# Patient Record
Sex: Female | Born: 1955 | Race: White | Hispanic: No | Marital: Married | Smoking: Never smoker
Health system: Southern US, Community
[De-identification: ages and names within clinical notes are randomized; demographics above are authoritative.]

## PROBLEM LIST (undated history)

## (undated) DIAGNOSIS — M1990 Unspecified osteoarthritis, unspecified site: Secondary | ICD-10-CM

## (undated) DIAGNOSIS — K219 Gastro-esophageal reflux disease without esophagitis: Secondary | ICD-10-CM

## (undated) DIAGNOSIS — R112 Nausea with vomiting, unspecified: Secondary | ICD-10-CM

## (undated) DIAGNOSIS — J45909 Unspecified asthma, uncomplicated: Secondary | ICD-10-CM

## (undated) DIAGNOSIS — Z8489 Family history of other specified conditions: Secondary | ICD-10-CM

## (undated) DIAGNOSIS — M797 Fibromyalgia: Secondary | ICD-10-CM

## (undated) DIAGNOSIS — Z9889 Other specified postprocedural states: Secondary | ICD-10-CM

## (undated) DIAGNOSIS — F419 Anxiety disorder, unspecified: Secondary | ICD-10-CM

## (undated) DIAGNOSIS — R519 Headache, unspecified: Secondary | ICD-10-CM

## (undated) HISTORY — PX: ANKLE SURGERY: SHX546

## (undated) HISTORY — PX: DILATION AND CURETTAGE OF UTERUS: SHX78

## (undated) HISTORY — PX: EYE SURGERY: SHX253

## (undated) HISTORY — PX: CERVICAL SPINE SURGERY: SHX589

## (undated) HISTORY — PX: MENISCUS REPAIR: SHX5179

## (undated) HISTORY — PX: CYST REMOVAL HAND: SHX6279

## (undated) HISTORY — PX: APPENDECTOMY: SHX54

## (undated) HISTORY — PX: OTHER SURGICAL HISTORY: SHX169

## (undated) HISTORY — PX: OVARY SURGERY: SHX727

---

## 1999-09-07 ENCOUNTER — Other Ambulatory Visit: Admission: RE | Admit: 1999-09-07 | Discharge: 1999-09-07 | Payer: Self-pay | Admitting: Obstetrics and Gynecology

## 1999-09-15 ENCOUNTER — Encounter: Admission: RE | Admit: 1999-09-15 | Discharge: 1999-09-15 | Payer: Self-pay | Admitting: Neurosurgery

## 1999-09-15 ENCOUNTER — Encounter: Payer: Self-pay | Admitting: Neurosurgery

## 1999-09-23 ENCOUNTER — Encounter: Payer: Self-pay | Admitting: Neurosurgery

## 1999-09-24 ENCOUNTER — Encounter: Payer: Self-pay | Admitting: Neurosurgery

## 1999-09-24 ENCOUNTER — Ambulatory Visit (HOSPITAL_COMMUNITY): Admission: RE | Admit: 1999-09-24 | Discharge: 1999-09-25 | Payer: Self-pay | Admitting: Neurosurgery

## 1999-10-14 ENCOUNTER — Encounter: Payer: Self-pay | Admitting: Neurosurgery

## 1999-10-14 ENCOUNTER — Encounter: Admission: RE | Admit: 1999-10-14 | Discharge: 1999-10-14 | Payer: Self-pay | Admitting: Neurosurgery

## 1999-10-19 ENCOUNTER — Encounter: Admission: RE | Admit: 1999-10-19 | Discharge: 1999-10-19 | Payer: Self-pay | Admitting: Neurosurgery

## 1999-10-19 ENCOUNTER — Encounter: Payer: Self-pay | Admitting: Neurosurgery

## 1999-11-18 ENCOUNTER — Encounter: Payer: Self-pay | Admitting: Neurosurgery

## 1999-11-18 ENCOUNTER — Encounter: Admission: RE | Admit: 1999-11-18 | Discharge: 1999-11-18 | Payer: Self-pay | Admitting: Neurosurgery

## 2000-07-17 ENCOUNTER — Encounter: Payer: Self-pay | Admitting: Neurosurgery

## 2000-07-17 ENCOUNTER — Encounter: Admission: RE | Admit: 2000-07-17 | Discharge: 2000-07-17 | Payer: Self-pay | Admitting: Neurosurgery

## 2000-11-13 ENCOUNTER — Other Ambulatory Visit: Admission: RE | Admit: 2000-11-13 | Discharge: 2000-11-13 | Payer: Self-pay | Admitting: Obstetrics and Gynecology

## 2001-12-12 ENCOUNTER — Encounter: Admission: RE | Admit: 2001-12-12 | Discharge: 2001-12-12 | Payer: Self-pay | Admitting: *Deleted

## 2001-12-12 ENCOUNTER — Encounter: Payer: Self-pay | Admitting: *Deleted

## 2002-01-23 ENCOUNTER — Other Ambulatory Visit: Admission: RE | Admit: 2002-01-23 | Discharge: 2002-01-23 | Payer: Self-pay | Admitting: Obstetrics and Gynecology

## 2003-09-22 ENCOUNTER — Other Ambulatory Visit: Admission: RE | Admit: 2003-09-22 | Discharge: 2003-09-22 | Payer: Self-pay | Admitting: Obstetrics and Gynecology

## 2005-02-01 ENCOUNTER — Encounter: Admission: RE | Admit: 2005-02-01 | Discharge: 2005-02-01 | Payer: Self-pay | Admitting: Rheumatology

## 2005-07-26 ENCOUNTER — Other Ambulatory Visit: Admission: RE | Admit: 2005-07-26 | Discharge: 2005-07-26 | Payer: Self-pay | Admitting: Obstetrics and Gynecology

## 2005-07-28 ENCOUNTER — Encounter: Admission: RE | Admit: 2005-07-28 | Discharge: 2005-07-28 | Payer: Self-pay | Admitting: Obstetrics and Gynecology

## 2005-08-18 ENCOUNTER — Encounter: Admission: RE | Admit: 2005-08-18 | Discharge: 2005-08-18 | Payer: Self-pay | Admitting: Obstetrics and Gynecology

## 2005-08-27 ENCOUNTER — Encounter: Admission: RE | Admit: 2005-08-27 | Discharge: 2005-08-27 | Payer: Self-pay | Admitting: Obstetrics and Gynecology

## 2006-10-05 ENCOUNTER — Other Ambulatory Visit: Admission: RE | Admit: 2006-10-05 | Discharge: 2006-10-05 | Payer: Self-pay | Admitting: Obstetrics and Gynecology

## 2006-10-11 ENCOUNTER — Encounter: Admission: RE | Admit: 2006-10-11 | Discharge: 2006-10-11 | Payer: Self-pay | Admitting: Obstetrics and Gynecology

## 2007-09-01 ENCOUNTER — Emergency Department (HOSPITAL_COMMUNITY): Admission: EM | Admit: 2007-09-01 | Discharge: 2007-09-01 | Payer: Self-pay | Admitting: Family Medicine

## 2007-11-16 ENCOUNTER — Encounter: Admission: RE | Admit: 2007-11-16 | Discharge: 2007-11-16 | Payer: Self-pay | Admitting: Obstetrics and Gynecology

## 2009-04-06 ENCOUNTER — Encounter: Admission: RE | Admit: 2009-04-06 | Discharge: 2009-04-06 | Payer: Self-pay | Admitting: Obstetrics and Gynecology

## 2010-08-23 ENCOUNTER — Encounter: Admission: RE | Admit: 2010-08-23 | Discharge: 2010-08-23 | Payer: Self-pay | Admitting: Obstetrics and Gynecology

## 2010-10-30 ENCOUNTER — Encounter: Payer: Self-pay | Admitting: Obstetrics and Gynecology

## 2010-10-31 ENCOUNTER — Encounter: Payer: Self-pay | Admitting: Obstetrics and Gynecology

## 2010-10-31 ENCOUNTER — Encounter: Payer: Self-pay | Admitting: Neurosurgery

## 2011-02-25 NOTE — Op Note (Signed)
[place]. [facility]  Patient:    KYANNA MAHRT                          MRN: [id number] Proc. Date: 09/24/99 Adm. Date:  [id number] Attending:  Danella Penton                           Operative Report  PREOPERATIVE DIAGNOSIS:  C5 and C6 spondylosis, cervical stenosis, and C6 radiculopathy.  POSTOPERATIVE DIAGNOSIS:  C5 and C6 spondylosis, cervical stenosis, and C6 radiculopathy.  PROCEDURE:  Anterior C5 and C6 diskectomy, decompression of the spinal cord, and bilateral foraminotomy, bone graft, 7 mm height Synthes plate.  MICROSCOPE:  Midas Rex.  SURGEON:  Tanya Nones. Jeral Fruit, M.D.  ASSISTANT:  Alanson Aly. Roxan Hockey, M.D.  ANESTHESIA:  CLINICAL HISTORY:  The patient is a 55 year old lady who had been complaining of neck and left upper extremity pain and weakness.  X-ray showed that she has a bad case of cervical spondylosis with the canal being 7-8 mm.  Surgery was advised.  She knew of the risks such as infection, CSF leak, worsening of the pain, paralysis, and need for further surgery.  She also was fully aware that she had a bilateral _________.  DESCRIPTION OF PROCEDURE:  The patient was taken to the OR and the left side of the neck was prepped with Betadine.  A transverse incision through the skin, platysma down to the cervical spine was done.  X-rays showed that we were at the level of 5-6.  There was an anterior osteophyte which was removed.  We opened the ligament and with the microscope, we did a total gross diskectomy.  Then, we opened the posterior ligament.  There was a herniated disk going to the left side showing spondylosis.  Decompression of the spinal cord as well as both C6 nerve roots was achieved.  Then, a piece of bone graft, 7 mm in height was inserted between 5-6. Prior to _________ endplate were drilled with the Midas Rex.  Then, a Synthes plate from C5 to C6 using four screws was done.  Lateral cervical spine  showed ood position of the graft.  The area was irrigated.  Investigation of the esophagus, trachea and carotid was negative.  Hemostasis was done with bipolar.  Then, the  wound was closed with Vicryl and Steri-Strips. DD:  09/24/99 TD:  09/24/99 Job: [id number]

## 2011-10-21 ENCOUNTER — Other Ambulatory Visit: Payer: Self-pay | Admitting: Obstetrics and Gynecology

## 2011-10-21 DIAGNOSIS — Z1231 Encounter for screening mammogram for malignant neoplasm of breast: Secondary | ICD-10-CM

## 2011-11-07 ENCOUNTER — Ambulatory Visit
Admission: RE | Admit: 2011-11-07 | Discharge: 2011-11-07 | Disposition: A | Discharge status: Home / Self Care | Payer: PRIVATE HEALTH INSURANCE | Source: Ambulatory Visit | Attending: Obstetrics and Gynecology | Admitting: Obstetrics and Gynecology

## 2011-11-07 DIAGNOSIS — Z1231 Encounter for screening mammogram for malignant neoplasm of breast: Secondary | ICD-10-CM

## 2011-11-15 ENCOUNTER — Other Ambulatory Visit: Payer: Self-pay

## 2011-11-15 ENCOUNTER — Encounter (HOSPITAL_COMMUNITY): Payer: Self-pay | Admitting: *Deleted

## 2011-11-15 ENCOUNTER — Observation Stay (HOSPITAL_COMMUNITY)
Admission: EM | Admit: 2011-11-15 | Discharge: 2011-11-16 | Disposition: A | Discharge status: Home / Self Care | Payer: PRIVATE HEALTH INSURANCE | Attending: Emergency Medicine | Admitting: Emergency Medicine

## 2011-11-15 ENCOUNTER — Emergency Department (HOSPITAL_COMMUNITY): Payer: PRIVATE HEALTH INSURANCE

## 2011-11-15 DIAGNOSIS — E7800 Pure hypercholesterolemia, unspecified: Secondary | ICD-10-CM | POA: Insufficient documentation

## 2011-11-15 DIAGNOSIS — R079 Chest pain, unspecified: Principal | ICD-10-CM | POA: Insufficient documentation

## 2011-11-15 DIAGNOSIS — R0789 Other chest pain: Secondary | ICD-10-CM

## 2011-11-15 HISTORY — DX: Fibromyalgia: M79.7

## 2011-11-15 LAB — CBC
HCT: 39.1 % (ref 36.0–46.0)
Hemoglobin: 13.3 g/dL (ref 12.0–15.0)
MCH: 30.6 pg (ref 26.0–34.0)
MCHC: 34 g/dL (ref 30.0–36.0)
MCV: 90.1 fL (ref 78.0–100.0)
Platelets: 339 10*3/uL (ref 150–400)
RBC: 4.34 MIL/uL (ref 3.87–5.11)
WBC: 8.4 10*3/uL (ref 4.0–10.5)

## 2011-11-15 LAB — BASIC METABOLIC PANEL
BUN: 16 mg/dL (ref 6–23)
CO2: 29 mEq/L (ref 19–32)
Chloride: 104 mEq/L (ref 96–112)
Creatinine, Ser: 0.65 mg/dL (ref 0.50–1.10)
GFR calc Af Amer: >90 mL/min (ref >90)
Glucose, Bld: 89 mg/dL (ref 70–99)
Potassium: 4.1 mEq/L (ref 3.5–5.1)
Sodium: 141 mEq/L (ref 135–145)

## 2011-11-15 LAB — POCT I-STAT TROPONIN I: Troponin i, poc: 0 ng/mL (ref 0.00–0.08)

## 2011-11-15 MED ORDER — MORPHINE SULFATE 4 MG/ML IJ SOLN
4.0000 mg | Freq: Once | INTRAMUSCULAR | Status: AC
  Administered 2011-11-15: 4 mg via INTRAVENOUS

## 2011-11-15 MED ORDER — NITROGLYCERIN 2 % TD OINT: 1.0000 [in_us] | TOPICAL_OINTMENT | Freq: Four times a day (QID) | TRANSDERMAL | Status: DC

## 2011-11-15 MED ORDER — MORPHINE SULFATE 4 MG/ML IJ SOLN
INTRAMUSCULAR | Status: AC
  Administered 2011-11-15: 4 mg via INTRAVENOUS
  Filled 2011-11-15: qty 1

## 2011-11-15 NOTE — ED Notes (Signed)
The pt is c.o sudden sharp rt sided chest pain with radiation up into her rt neck and in the back of her neck.  Pt hyperventilating through the pain..  Nasal 02 at 2 liters and iv fluid  Attached to the saline lok

## 2011-11-15 NOTE — ED Notes (Signed)
The pt is having some rt sided pain again.  Morphine given for pain

## 2011-11-15 NOTE — ED Notes (Signed)
Iv per ems.  The pt is allergic to aspirin

## 2011-11-15 NOTE — ED Notes (Signed)
The pt came by [facility] from [facility]. She is c/o chest pain lt sided with lat arm radiation.  She says shehas chest pains frequently from fibromyalgia and this chest pain feels a little different.  She was given  Nitro sl x 4 and she says she has a  Headache now and she says her chest pain is about a 3/10.   She had a headache before the nitro

## 2011-11-15 NOTE — ED Provider Notes (Signed)
History     CSN: [id number]  Arrival date & time 11/15/11  2001   First MD Initiated Contact with Patient 11/15/11 2032      Chief Complaint  Patient presents with  . Chest Pain    (Consider location/radiation/quality/duration/timing/severity/associated sxs/prior treatment) HPI History provided by pt.   Pt developed constant, left-sided chest pressure w/ radiation down LUE this afternoon at work.  Describes as an "elephant" sitting on chest.   Associated w/ diaphoresis and nausea but she has been experiencing both of these symptoms all day today.  Denies SOB and has not had any recent exertional SOB.  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  PMH fibromyalgia only.  Does not smoke cigarettes and no FH early MI.  Has pain in chest often associated w/ fibro but this pain felt different and has never radiated.  No RF for PE and denies LE pain/edema.  Pt has no CP currently but mild pain in right shoulder and right upper back.    Past Medical History  Diagnosis Date  . Fibromyalgia     History reviewed. No pertinent past surgical history.  History reviewed. No pertinent family history.  History  Substance Use Topics  . Smoking status: Never Smoker   . Smokeless tobacco: Not on file  . Alcohol Use: No    OB History    Grav Para Term Preterm Abortions TAB SAB Ect Mult Living                  Review of Systems  All other systems reviewed and are negative.    Allergies  Aspirin and Ibuprofen  Home Medications   Current Outpatient Rx  Name Route Sig Dispense Refill  . ALBUTEROL SULFATE HFA 108 (90 BASE) MCG/ACT IN AERS Inhalation Inhale 2 puffs into the lungs every 6 (six) hours as needed. For shortness of breath    . DIPHENHYDRAMINE HCL 25 MG PO TABS Oral Take 25 mg by mouth at bedtime as needed. For allergies    . NAPROXEN SODIUM 220 MG PO TABS Oral Take 220 mg by mouth 2 (two) times daily as needed. For pain      BP 123/80  Pulse 78  Temp(Src) 97.9 F (36.6  C) (Oral)  Resp 20  SpO2 98%  Physical Exam  Nursing note and vitals reviewed. Constitutional: She is oriented to person, place, and time. She appears well-developed and well-nourished. No distress.  HENT:  Head: Normocephalic and atraumatic.  Eyes:       Normal appearance  Neck: Normal range of motion.  Cardiovascular: Normal rate and regular rhythm.   Pulmonary/Chest: Effort normal and breath sounds normal. No respiratory distress.       Mild, diffuse tenderness of chest that pt reports is chronic.  No pain in chest w/ ROM LUE.    Abdominal: Soft. Bowel sounds are normal. She exhibits no distension. There is no tenderness. There is no guarding.  Musculoskeletal: She exhibits no edema and no tenderness.       2+ Dorsalis Pedis pulses  Neurological: She is alert and oriented to person, place, and time.  Skin: Skin is warm and dry. No rash noted. She is not diaphoretic.  Psychiatric: She has a normal mood and affect. Her behavior is normal.    ED Course  Procedures (including critical care time)   Date: 11/16/2011  Rate: 82  Rhythm: normal sinus rhythm  QRS Axis: normal  Intervals: normal  ST/T Wave abnormalities: normal  Conduction  Disutrbances:none  Narrative Interpretation:   Old EKG Reviewed: none available    Labs Reviewed  BASIC METABOLIC PANEL  CBC  POCT I-STAT TROPONIN I  HEPATIC FUNCTION PANEL  LIPASE, BLOOD  POCT I-STAT TROPONIN I  POCT I-STAT TROPONIN I   No results found.   No diagnosis found.    MDM  56yo F, low risk for ACS, presents w/ c/o radiating L-sided chest pressure w/ associated nausea and diaphoresis.  Was pain free on my exam but then had an episode of severe, substernal squeezing sensation that radiated through to her back and appeared very uncomfortable as well as anxious.  Initial as well as repeat EKG non-ischemic.  Pt received IV morphine which completely resolved pain.  Dr. Ranae Palms recommended CT angio chest to r/o dissection.   All labs have been unremarkable. If CT is neg, pt will be moved to CDU and placed on CP protocol.  Dr. Alto Denver to follow up results.          Otilio Miu, PA 11/16/11 1051

## 2011-11-15 NOTE — ED Notes (Signed)
Repeat EKG done STAT per PA.  When pt returned from CT, EMT in room to place pt back on monitor.  <61min later PA entered and pt in significant pain.  Repeat EKG done at this time and handed to Dr. Ranae Palms and PA.  Extra copy placed in pt chart

## 2011-11-15 NOTE — ED Notes (Signed)
The pt is  Feeling better at present

## 2011-11-15 NOTE — ED Notes (Signed)
The pt has had morphine iv and her pain is better.  She is sleeping intermittently

## 2011-11-16 ENCOUNTER — Other Ambulatory Visit: Payer: Self-pay

## 2011-11-16 ENCOUNTER — Observation Stay (HOSPITAL_COMMUNITY): Payer: PRIVATE HEALTH INSURANCE

## 2011-11-16 DIAGNOSIS — R072 Precordial pain: Secondary | ICD-10-CM

## 2011-11-16 LAB — POCT I-STAT TROPONIN I
Troponin i, poc: 0.02 ng/mL (ref 0.00–0.08)
Troponin i, poc: 0 ng/mL (ref 0.00–0.08)

## 2011-11-16 LAB — HEPATIC FUNCTION PANEL
ALT: 22 U/L (ref 0–35)
AST: 20 U/L (ref 0–37)
Total Protein: 6.7 g/dL (ref 6.0–8.3)

## 2011-11-16 MED ORDER — ONDANSETRON HCL 4 MG/2ML IJ SOLN
INTRAMUSCULAR | Status: AC
  Administered 2011-11-16: 4 mg via INTRAVENOUS
  Filled 2011-11-16: qty 2

## 2011-11-16 MED ORDER — CYCLOBENZAPRINE HCL 10 MG PO TABS: 10.0000 mg | ORAL_TABLET | Freq: Two times a day (BID) | ORAL | Status: AC | PRN

## 2011-11-16 MED ORDER — ACETAMINOPHEN 325 MG PO TABS
650.0000 mg | ORAL_TABLET | Freq: Once | ORAL | Status: AC
  Administered 2011-11-16: 650 mg via ORAL
  Filled 2011-11-16: qty 2

## 2011-11-16 MED ORDER — ONDANSETRON HCL 4 MG/2ML IJ SOLN
4.0000 mg | Freq: Once | INTRAMUSCULAR | Status: AC
  Administered 2011-11-16: 4 mg via INTRAVENOUS

## 2011-11-16 MED ORDER — IOHEXOL 300 MG/ML  SOLN
100.0000 mL | Freq: Once | INTRAMUSCULAR | Status: AC | PRN
  Administered 2011-11-16: 100 mL via INTRAVENOUS

## 2011-11-16 NOTE — Progress Notes (Signed)
*  PRELIMINARY RESULTS* Echocardiogram Echocardiogram Stress Test has been performed.  Lindsay Watts 11/16/2011, 3:28 PM

## 2011-11-16 NOTE — ED Notes (Signed)
Pt given something to drink per order. Presently compliant with current plan of care.

## 2011-11-16 NOTE — ED Notes (Signed)
Patient transported for stress test

## 2011-11-16 NOTE — ED Notes (Signed)
1000cc nss added to iv [employer]

## 2011-11-16 NOTE — ED Provider Notes (Signed)
Patient was evaluated by myself. I have just notified her of the findings of her CAT scan which was negative. The patient began to have chest pain again. Vital signs remained stable there were no changes on the monitor. Patient did not 1 any pain medication. EKG and troponin were repeated. EKG was unchanged from previous. Troponin was still within normal limits. Patient also grew concerned that she could have a possible gallbladder issue. Hepatic function panel and lipase were ordered. Patient's abdomen was soft and nontender.  CV: Regular rate and rhythm, no murmurs rubs or gallops Lungs: Clear to auscultation bilaterally Abdomen: Soft, nontender, nondistended, positive bowel sounds  Date: 11/16/2011  Rate: 64  Rhythm: normal sinus rhythm  QRS Axis: normal  Intervals: normal  ST/T Wave abnormalities: normal  Conduction Disutrbances:none  Narrative Interpretation:   Old EKG Reviewed: unchanged  Pending return of normal labs from this encounter patient will be admitted to CDU for further management and stress testing for her chest pain.  Patient's liver panel, lipase, and troponin all returned normal. Patient was moved to the CDU. She has stress testing ordered for the morning. The CDU orders have already been entered and affect.  Assessment: 56 year-old female with history of intermittent chest pain with hypercholesterolemia as only known risk factor for ACS. Plan: Admit to CDU for chest pain protocol.  Cyndra Numbers, MD 11/16/11 [phone]

## 2011-11-16 NOTE — ED Notes (Signed)
The pt says her pain is ok at present

## 2011-11-16 NOTE — ED Provider Notes (Signed)
The patient was evaluated while in the echocardiography suite at 10:30 on 11/16/11. At this point. She is improved as compared to yesterday and during the night. She has mild, residual discomfort in her chest and back. The pain is reproduced with palpation of both her chest and back. She is currently nauseated. She was sent to go for a stress test to evaluate for cardiac disease. She is a low risk cardiac patient with only, hypercholesterolemia, as her risk factor. She does not take aspirin regularly. While being evaluated in the CDU, she was treated with morphine for periods of "crushing chest pain", And each time, improved. She is comfortable proceeding with stress echo.  Medical decision-making: Atypical chest pain for cardiac disease in a very low risk for cardiac disease patient. I believe that the chest pain that she has had does not indicate ongoing coronary ischemia. She is therefore, a candidate to continue the protocol for stress echo.  Flint Melter, MD 11/16/11 [phone]

## 2011-11-16 NOTE — ED Notes (Signed)
Pt being transported to echo lab 

## 2011-11-16 NOTE — ED Notes (Signed)
The pt has been selleping since she returned from c-t approx 20 minutes ago

## 2011-11-16 NOTE — ED Notes (Signed)
The pt had her 3rd episode of severe lt chest pain through to her posterior chest on the rt side and lt side.  The episode lasted approc 1-2 minutes.  She was awakened from sleep.  No chaNGE IN HER EKG

## 2011-11-16 NOTE — ED Notes (Signed)
Returned from stress echo.

## 2011-11-16 NOTE — ED Notes (Signed)
DR Effie Shy TO ECHO DEPT TO SPEAK WITH CARDIOLOGY PA SO PT CAN HAVE STRESS TEST

## 2011-11-16 NOTE — ED Notes (Signed)
Pt returned to cdu. Test not performed. edp has spoken with cardiologist and test is now authorized.

## 2011-11-16 NOTE — ED Notes (Signed)
C/o persistent pain. PA consulted with echo lab and agreement made to still have pt complete stress echo.

## 2011-11-16 NOTE — Progress Notes (Signed)
Observation review is complete. 

## 2011-11-16 NOTE — ED Provider Notes (Signed)
7:45 AM Pt in CDU under chest pain protocol. History of fibromyalgia. Presented to emergency department with complaint of left-sided chest pressure that began at work yesterday. No known risk factors for coronary disease. On my assessment, the patient is alert and oriented x3. She is in no distress. She does complain of mild continued pain in her chest and back, which are worse on my palpation of the area. Lungs are clear to auscultation bilaterally. Heart with regular rate and rhythm without murmur. Abdomen is soft, nontender nondistended. Moves all extremities well.    8:00 AM I have spoken with the tech in the echo department regarding this patient's stress test in the light of her continued pain. Her pain is not consistent with that of ACS. Will proceed with a stress test.    10:30 AM NPP responsible for her menstruation of the stress test requested that the emergency Department attending physician speak with the cardiologist regarding going forward with this patient's stress test tomorrow for continued chest pain. EDP has seen the patient and feels that she is stable for continued testing and agrees that this is atypical for ACS. Will speak with the cardiologist. However, as there was other testing to be performed in the cardiology department today, the patient's echo cannot be performed until approximately 3 PM. I will notify the patient of the delay.    11:20 AM Delay has been discussed with the patient. She agrees to stay to complete testing. She requests pain medication for her headache; Tylenol has been ordered.        3:53 PM I spoke with Dr. Patty Sermons regarding this patient's stress echo, which was normal. There is also been discussed with the patient who will be discharged home. I have instructed her to followup with her primary doctor regarding her visit to the emergency department today. 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  Shaaron Adler, [place] 11/16/11 1554

## 2011-11-18 NOTE — ED Provider Notes (Signed)
Medical screening examination/treatment/procedure(s) were performed by non-physician practitioner and as supervising physician I was immediately available for consultation/collaboration.   Kirstan Fentress, MD 11/18/11 1137 

## 2011-11-18 NOTE — ED Provider Notes (Signed)
Medical screening examination/treatment/procedure(s) were performed by non-physician practitioner and as supervising physician I was immediately available for consultation/collaboration.   Caydyn Sprung, MD 11/18/11 1139 

## 2012-02-05 ENCOUNTER — Ambulatory Visit (INDEPENDENT_AMBULATORY_CARE_PROVIDER_SITE_OTHER): Payer: PRIVATE HEALTH INSURANCE | Admitting: Physician Assistant

## 2012-02-05 VITALS — BP 126/75 | HR 86 | Temp 98.0°F | Resp 16 | Ht 63.0 in | Wt 176.8 lb

## 2012-02-05 DIAGNOSIS — L237 Allergic contact dermatitis due to plants, except food: Secondary | ICD-10-CM

## 2012-02-05 DIAGNOSIS — L299 Pruritus, unspecified: Secondary | ICD-10-CM

## 2012-02-05 DIAGNOSIS — L255 Unspecified contact dermatitis due to plants, except food: Secondary | ICD-10-CM

## 2012-02-05 LAB — GLUCOSE, POCT (MANUAL RESULT ENTRY): POC Glucose: 87

## 2012-02-05 MED ORDER — PREDNISONE 20 MG PO TABS: ORAL_TABLET | ORAL | Status: AC

## 2012-02-05 MED ORDER — METHYLPREDNISOLONE ACETATE 80 MG/ML IJ SUSP
80.0000 mg | Freq: Once | INTRAMUSCULAR | Status: AC
  Administered 2012-02-05: 80 mg via INTRAMUSCULAR

## 2012-02-05 NOTE — Progress Notes (Signed)
Patient ID: Lindsay Watts MRN: [id number], DOB: [birth year], 56 y.o. Date of Encounter: 02/05/2012, 10:12 AM  Primary Physician: Benita Stabile, MD, MD  Chief Complaint: Pruritic rash  HPI: 56 y.o. year old female with presents with a 14 day history of mildly erythematous pruritic rash along left leg, right fore arm, and neck. Patient was doing yard work prior to the development of the rash. Known poison ivy in the vicinity. Has washed all clothing and linens that have been exposed. Lesions now weeping clear fluid. Has tried benadryl, hydrocortisone cream without success. Husband seen by his PCP 2 days prior and given shot that has helped him considerably. Patient requests shot to help. Patient is otherwise doing well without issues or complaints.  Past Medical History  Diagnosis Date  . Fibromyalgia      Home Meds: Prior to Admission medications   Medication Sig Start Date End Date Taking? Authorizing Provider  budesonide-formoterol (SYMBICORT) 80-4.5 MCG/ACT inhaler Inhale 2 puffs into the lungs as needed.   Yes Historical Provider, MD  diphenhydrAMINE (BENADRYL) 25 MG tablet Take 25 mg by mouth at bedtime as needed. For allergies   Yes Historical Provider, MD  naproxen sodium (ANAPROX) 220 MG tablet Take 220 mg by mouth 2 (two) times daily as needed. For pain   Yes Historical Provider, MD  albuterol (PROVENTIL HFA;VENTOLIN HFA) 108 (90 BASE) MCG/ACT inhaler Inhale 2 puffs into the lungs every 6 (six) hours as needed. For shortness of breath    Historical Provider, MD    Allergies:  Allergies  Allergen Reactions  . Aspirin Shortness Of Breath  . Ibuprofen Shortness Of Breath    History   Social History  . Marital Status: Married    Spouse Name: N/A    Number of Children: N/A  . Years of Education: N/A   Occupational History  . Not on file.   Social History Main Topics  . Smoking status: Never Smoker   . Smokeless tobacco: Not on file  . Alcohol Use: No  . Drug Use:    . Sexually Active:    Other Topics Concern  . Not on file   Social History Narrative  . No narrative on file     Review of Systems: Constitutional: negative for chills, fever, night sweats, weight changes, or fatigue  HEENT: negative for vision changes, hearing loss, congestion, rhinorrhea, ST, epistaxis, or sinus pressure Cardiovascular: negative for chest pain or palpitations Respiratory: negative for hemoptysis, wheezing, shortness of breath, or cough Abdominal: negative for abdominal pain, nausea, vomiting, diarrhea, or constipation Dermatological: see above Neurologic: negative for headache, dizziness, or syncope   Physical Exam: Blood pressure 126/75, pulse 86, temperature 98 F (36.7 C), temperature source Oral, resp. rate 16, height 5\' 3"  (1.6 m), weight 176 lb 12.8 oz (80.196 kg)., Body mass index is 31.32 kg/(m^2). General: Well developed, well nourished, in no acute distress. Head: Normocephalic, atraumatic, eyes without discharge, sclera non-icteric, nares are without discharge. Bilateral auditory canals clear, TM's are without perforation, pearly grey and translucent with reflective cone of light bilaterally. Oral cavity moist, posterior pharynx without exudate, erythema, peritonsillar abscess, or post nasal drip.  Neck: Supple. No thyromegaly. Full ROM. No lymphadenopathy. Lungs: Clear bilaterally to auscultation without wheezes, rales, or rhonchi. Breathing is unlabored. Heart: RRR with S1 S2. No murmurs, rubs, or gallops appreciated. Msk:  Strength and tone normal for age. Extremities/Skin: Warm and dry. Multiple vesicular weeping lesions along left leg, right forearm, and neck consistent with poison ivy.  No secondary infections present. No clubbing or cyanosis. No edema.  Neuro: Alert and oriented X 3. Moves all extremities spontaneously. Gait is normal. CNII-XII grossly in tact. Psych:  Responds to questions appropriately with a normal affect.   Labs: Results for  orders placed in visit on 02/05/12  GLUCOSE, POCT (MANUAL RESULT ENTRY)      Component Value Range   POC Glucose 87       ASSESSMENT AND PLAN:  56 y.o. year old female with poison ivy -DepoMedrol 80 mg IM -Prednisone 20 mg #12 3X2, 2x2, 1x2, no RF SED -Zyrtec -Zantac -Benadryl -Wash all contaminated clothes and linens -RTC 10 days if symptoms persist, sooner if they worsen   Signed, Eula Listen, PA-C 02/05/2012 10:12 AM

## 2013-08-30 ENCOUNTER — Other Ambulatory Visit: Payer: Self-pay

## 2013-08-30 DIAGNOSIS — Z1231 Encounter for screening mammogram for malignant neoplasm of breast: Secondary | ICD-10-CM

## 2013-10-02 ENCOUNTER — Ambulatory Visit
Admission: RE | Admit: 2013-10-02 | Discharge: 2013-10-02 | Disposition: A | Discharge status: Home / Self Care | Payer: BC Managed Care – PPO | Source: Ambulatory Visit

## 2013-10-02 DIAGNOSIS — Z1231 Encounter for screening mammogram for malignant neoplasm of breast: Secondary | ICD-10-CM

## 2013-12-27 ENCOUNTER — Other Ambulatory Visit (HOSPITAL_COMMUNITY)
Admission: RE | Admit: 2013-12-27 | Discharge: 2013-12-27 | Disposition: A | Discharge status: Home / Self Care | Payer: BC Managed Care – PPO | Source: Ambulatory Visit | Attending: Family Medicine | Admitting: Family Medicine

## 2013-12-27 ENCOUNTER — Other Ambulatory Visit: Payer: Self-pay | Admitting: Family Medicine

## 2013-12-27 DIAGNOSIS — Z124 Encounter for screening for malignant neoplasm of cervix: Secondary | ICD-10-CM | POA: Insufficient documentation

## 2014-01-02 ENCOUNTER — Other Ambulatory Visit: Payer: Self-pay | Admitting: Family Medicine

## 2014-01-02 DIAGNOSIS — R1310 Dysphagia, unspecified: Secondary | ICD-10-CM

## 2014-01-07 ENCOUNTER — Other Ambulatory Visit: Payer: BC Managed Care – PPO

## 2014-09-30 ENCOUNTER — Ambulatory Visit
Admission: RE | Admit: 2014-09-30 | Discharge: 2014-09-30 | Disposition: A | Discharge status: Home / Self Care | Payer: BC Managed Care – PPO | Source: Ambulatory Visit | Attending: Family Medicine | Admitting: Family Medicine

## 2014-09-30 ENCOUNTER — Other Ambulatory Visit: Payer: Self-pay | Admitting: Family Medicine

## 2014-09-30 DIAGNOSIS — W19XXXA Unspecified fall, initial encounter: Secondary | ICD-10-CM

## 2014-12-11 ENCOUNTER — Other Ambulatory Visit: Payer: Self-pay

## 2014-12-11 DIAGNOSIS — Z1231 Encounter for screening mammogram for malignant neoplasm of breast: Secondary | ICD-10-CM

## 2014-12-15 ENCOUNTER — Ambulatory Visit
Admission: RE | Admit: 2014-12-15 | Discharge: 2014-12-15 | Disposition: A | Discharge status: Home / Self Care | Payer: BLUE CROSS/BLUE SHIELD | Source: Ambulatory Visit

## 2014-12-15 ENCOUNTER — Encounter (INDEPENDENT_AMBULATORY_CARE_PROVIDER_SITE_OTHER): Payer: Self-pay

## 2014-12-15 DIAGNOSIS — Z1231 Encounter for screening mammogram for malignant neoplasm of breast: Secondary | ICD-10-CM

## 2015-06-24 ENCOUNTER — Emergency Department (HOSPITAL_COMMUNITY)
Admission: EM | Admit: 2015-06-24 | Discharge: 2015-06-24 | Disposition: A | Discharge status: Home / Self Care | Payer: BLUE CROSS/BLUE SHIELD | Attending: Emergency Medicine | Admitting: Emergency Medicine

## 2015-06-24 ENCOUNTER — Encounter (HOSPITAL_COMMUNITY): Payer: Self-pay | Admitting: Emergency Medicine

## 2015-06-24 DIAGNOSIS — J029 Acute pharyngitis, unspecified: Secondary | ICD-10-CM | POA: Diagnosis present

## 2015-06-24 DIAGNOSIS — J45909 Unspecified asthma, uncomplicated: Secondary | ICD-10-CM | POA: Insufficient documentation

## 2015-06-24 DIAGNOSIS — M1990 Unspecified osteoarthritis, unspecified site: Secondary | ICD-10-CM | POA: Diagnosis not present

## 2015-06-24 DIAGNOSIS — Z79899 Other long term (current) drug therapy: Secondary | ICD-10-CM | POA: Insufficient documentation

## 2015-06-24 HISTORY — DX: Unspecified asthma, uncomplicated: J45.909

## 2015-06-24 HISTORY — DX: Unspecified osteoarthritis, unspecified site: M19.90

## 2015-06-24 MED ORDER — GI COCKTAIL ~~LOC~~
30.0000 mL | Freq: Once | ORAL | Status: AC
  Administered 2015-06-24: 30 mL via ORAL
  Filled 2015-06-24: qty 30

## 2015-06-24 NOTE — ED Notes (Signed)
Pt arrives via EMS from restaurant, states she has a hx of throat spasming and while eating tonight she had what she described as the worst episode ever. States Lindsay Watts is mostly resolved, rates 3/10. Has not seen a specialist for this, just PCP.

## 2015-06-24 NOTE — ED Notes (Addendum)
Pt out to dinner and had a spasm in her thoart while eating steak. Pt alet and oriented. Able to talk in compklete sentences. Pt states this was her worst episode ever.  BP 140/100 with EMS. Pt states normally low

## 2015-06-24 NOTE — ED Provider Notes (Signed)
CSN: [id number]     Arrival date & time 06/24/15  1905 History   First MD Initiated Contact with Patient 06/24/15 1914     Chief Complaint  Patient presents with  . Sore Throat    choked on a pepper     (Consider location/radiation/quality/duration/timing/severity/associated sxs/prior Treatment) Patient is a 59 y.o. female presenting with pharyngitis.  Sore Throat This is a recurrent problem. The current episode started today. The problem occurs constantly. The problem has been resolved. Associated symptoms include a sore throat. Pertinent negatives include no abdominal pain, arthralgias, chest pain, chills, congestion, coughing, fever, headaches, myalgias, nausea, rash or vomiting. Associated symptoms comments: Choking, trouble swollowing. The symptoms are aggravated by eating. She has tried nothing for the symptoms.    Past Medical History  Diagnosis Date  . Fibromyalgia   . Arthritis   . Asthma    History reviewed. No pertinent past surgical history. No family history on file. Social History  Substance Use Topics  . Smoking status: Never Smoker   . Smokeless tobacco: None  . Alcohol Use: No   OB History    No data available     Review of Systems  Constitutional: Negative for fever and chills.  HENT: Positive for sore throat and trouble swallowing. Negative for congestion.   Eyes: Negative for visual disturbance.  Respiratory: Negative for cough, shortness of breath and wheezing.   Cardiovascular: Negative for chest pain.  Gastrointestinal: Negative for nausea, vomiting, abdominal pain, diarrhea and constipation.  Genitourinary: Negative for dysuria, difficulty urinating and vaginal pain.  Musculoskeletal: Negative for myalgias and arthralgias.  Skin: Negative for rash.  Neurological: Negative for syncope and headaches.  Psychiatric/Behavioral: Negative for behavioral problems.  All other systems reviewed and are negative.     Allergies  Aspirin and  Ibuprofen  Home Medications   Prior to Admission medications   Medication Sig Start Date End Date Taking? Authorizing Provider  albuterol (PROVENTIL HFA;VENTOLIN HFA) 108 (90 BASE) MCG/ACT inhaler Inhale 2 puffs into the lungs every 6 (six) hours as needed. For shortness of breath   Yes Historical Provider, MD  budesonide-formoterol (SYMBICORT) 80-4.5 MCG/ACT inhaler Inhale 2 puffs into the lungs as needed (shortness of breath).    Yes Historical Provider, MD  diclofenac (VOLTAREN) 50 MG EC tablet Take 50 mg by mouth 2 (two) times daily as needed for mild pain.   Yes Historical Provider, MD  diphenhydrAMINE (BENADRYL) 25 MG tablet Take 25 mg by mouth at bedtime as needed. For allergies   Yes Historical Provider, MD  naproxen sodium (ANAPROX) 220 MG tablet Take 220 mg by mouth 2 (two) times daily as needed. For pain   Yes Historical Provider, MD   BP 142/92 mmHg  Pulse 94  Temp(Src) 98 F (36.7 C) (Oral)  Resp 14  Ht 5' 2.5" (1.588 m)  Wt 175 lb (79.379 kg)  BMI 31.48 kg/m2  SpO2 97% Physical Exam  Constitutional: She is oriented to person, place, and time. She appears well-developed and well-nourished. No distress.  HENT:  Head: Normocephalic and atraumatic.  Mouth/Throat: Uvula is midline and oropharynx is clear and moist.  Eyes: EOM are normal.  Neck: Normal range of motion.  Cardiovascular: Normal rate, regular rhythm and normal heart sounds.   No murmur heard. Pulmonary/Chest: Effort normal and breath sounds normal. No stridor. No respiratory distress. She has no wheezes.  Abdominal: Soft. There is no tenderness.  Musculoskeletal: She exhibits no edema.  Neurological: She is alert and oriented to person,  place, and time.  Skin: She is not diaphoretic.  Psychiatric: She has a normal mood and affect. Her behavior is normal.    ED Course  Procedures (including critical care time) Labs Review Labs Reviewed - No data to display  Imaging Review No results found. I have  personally reviewed and evaluated these images and lab results as part of my medical decision-making.   EKG Interpretation None      MDM   Final diagnoses:  Sore throat     Patient is a 59 year old female with recurrent difficulty swallowing episodes that presents tonight after choking on a piece of steak. Patient states that she was choking for approximately 10 minutes until the Heimlich was administered at which point she was able to throw up the food that was stuck in her throat. Patient currently has very sore throat and is thinking that she is unable to swallow liquids at this time. Patient has no stridor on exam and there is no edema seen of the posterior oropharynx. We will give a GI cocktail and try by mouth while. Patient will be given GI follow-up. Patient able to tolerate by mouth.    Renne Musca, MD 06/24/15 9163  Blanchie Dessert, MD 06/25/15 [phone]

## 2015-06-24 NOTE — Discharge Instructions (Signed)
Choking °Choking occurs when a food or object gets stuck in the throat or trachea, blocking the airway. If the airway is partly blocked, coughing will usually cause the food or object to come out. If the airway is completely blocked, immediate action is needed to help it come out. A complete airway blockage is life threatening because it causes breathing to stop. Choking is a true medical emergency that requires fast, appropriate action by anyone available. °SIGNS OF AIRWAY BLOCKAGE °There is a partial airway blockage if you or the person who is choking is:  °· Able to breathe and speak. °· Coughing loudly. °· Making loud noises. °There is a complete airway blockage if you or the person who is choking is:  °· Unable to breathe. °· Making soft or high-pitched sounds while breathing. °· Unable to cough or coughing weakly, ineffectively, or silently. °· Unable to cry, speak, or make sounds. °· Turning blue. °· Holding the neck with both arms. This is the universal sign of choking. °WHAT TO DO IF CHOKING OCCURS °If there is a partial airway blockage, allow coughing to clear the airway. Do not try to drink until the food or object comes out. If someone else has a partial airway blockage, do not interfere. Stay with him or her and watch for signs of complete airway blockage until the food or object comes out.  °If there is a complete airway blockage or if there is a partial airway blockage and the food or object does not come out, perform abdominal thrusts (also referred to as the Heimlich maneuver). Abdominal thrusts are used to create an artificial cough to try to clear the airway. Performing abdominal thrusts is part of a series of steps that should be done to help someone who is choking. Abdominal thrusts are usually done by someone else, but if you are alone, you can perform abdominal thrusts on yourself. Follow the procedure below that best fits your situation.  °IF SOMEONE ELSE IS CHOKING: °For a conscious adult:    °1. Ask the person whether he or she is choking. If the person nods, continue to step 2. °2. Stand or kneel behind the person and lean him or her forward slightly. °3. Make a fist with 1 hand, put your arms around the person, and grasp your fist with your other hand. Place the thumb side of your fist in the person's abdomen, just below the ribs. °4. Press inward and upward with both hands. °5. Repeat this maneuver until the object comes out and the person is able to breathe or until the person loses consciousness. °For an unconscious adult: °1. Shout for help. If someone responds, have him or her call local emergency services (911 in [place] If no one responds, call local emergency services yourself if possible. °2. Begin CPR, starting with compressions. Every time you open the airway to give rescue breaths, open the person's mouth. If you can see the food or object and it can be easily pulled out, remove it with your fingers. °3. After 5 cycles or 2 minutes of CPR, call local emergency services (911 in [place] if you or someone else did not already call. °For a conscious adult who is obese or in the later stages of pregnancy: °Abdominal thrusts may not be effective when helping people who are in the later stages of pregnancy or who are obese. In these instances, chest thrusts can be used.  °1. Ask the person whether he or she is choking. If the person   nods and has signs of complete airway blockage, continue to step 2. °2. Stand behind the person and wrap your arms around his or her chest (with your arms under the person's armpits). °3. Make a fist with 1 hand. Place the thumb side of your fist on the middle of the person's breastbone. °4. Grab your fist with your other hand and thrust backward. Continue this until the object comes out or until the person becomes unconscious. °For an unconscious adult who is obese or in the later stages of pregnancy:  °1. Shout for help. If someone responds, have him or her call local  emergency services (911 in [place] If no one responds, call local emergency services yourself if possible. °2. Begin CPR, starting with compressions. Every time you open the airway to give rescue breaths, open the person's mouth. If you can see the food or object and it can be easily pulled out, remove it with your fingers. °3. After 5 cycles or 2 minutes of CPR, call local emergency services (911 in [place] if you or someone else did not already call. °Note that abdominal thrusts (below the rib cage) should be used for a pregnant woman if possible. This should be possible until the later stages of pregnancy when there is no longer enough room between the enlarging uterus and the rib cage to perform the maneuver. At that point, chest thrusts must be used as described. °IF YOU ARE CHOKING: °1. Call local emergency services (911 in [place] if near a landline. Do not worry about communicating what is happening. Do not hang up the phone. Someone may be sent to help you anyway. °2. Make a fist with 1 hand. Put the thumb side of the fist against your stomach, just above the belly button and well below the breastbone. If you are pregnant or obese, put your fist on your chest instead, just below the breastbone and just above your lowest ribs. °3. Hold your fist with your other hand and bend over a hard surface, such as a table or chair. °4. Forcefully push your fist in and up. °5. Continue to do this until the food or object comes out. °PREVENTION  °To be prepared if choking occurs, learn how to correctly perform abdominal thrusts and give CPR by taking a certified first-aid training course.  °SEEK IMMEDIATE MEDICAL CARE IF: °· You have a fever after choking stops. °· You have problems breathing after choking stops. °· You received the Heimlich maneuver. °MAKE SURE YOU: °· Understand these instructions. °· Watch your condition. °· Get help right away if you are not doing well or get worse. °Document Released: 11/03/2004 Document  Revised: 02/10/2014 Document Reviewed: 05/08/2012 °[employer]® Patient Information ©2015 [employer]. This information is not intended to replace advice given to you by your health care provider. Make sure you discuss any questions you have with your health care provider. ° °

## 2015-06-25 ENCOUNTER — Other Ambulatory Visit: Payer: Self-pay | Admitting: Family Medicine

## 2015-06-25 DIAGNOSIS — R4702 Dysphasia: Secondary | ICD-10-CM

## 2015-06-29 ENCOUNTER — Ambulatory Visit
Admission: RE | Admit: 2015-06-29 | Discharge: 2015-06-29 | Disposition: A | Discharge status: Home / Self Care | Payer: BLUE CROSS/BLUE SHIELD | Source: Ambulatory Visit | Attending: Family Medicine | Admitting: Family Medicine

## 2015-06-29 DIAGNOSIS — R4702 Dysphasia: Secondary | ICD-10-CM

## 2016-03-21 ENCOUNTER — Other Ambulatory Visit: Payer: Self-pay | Admitting: Family Medicine

## 2016-03-21 DIAGNOSIS — Z1231 Encounter for screening mammogram for malignant neoplasm of breast: Secondary | ICD-10-CM

## 2016-03-30 ENCOUNTER — Ambulatory Visit: Payer: BLUE CROSS/BLUE SHIELD

## 2016-05-31 ENCOUNTER — Ambulatory Visit: Payer: BLUE CROSS/BLUE SHIELD

## 2016-07-06 ENCOUNTER — Other Ambulatory Visit: Payer: Self-pay | Admitting: Family Medicine

## 2016-07-06 DIAGNOSIS — Z803 Family history of malignant neoplasm of breast: Secondary | ICD-10-CM

## 2016-07-06 DIAGNOSIS — Z1231 Encounter for screening mammogram for malignant neoplasm of breast: Secondary | ICD-10-CM

## 2016-07-11 ENCOUNTER — Ambulatory Visit
Admission: RE | Admit: 2016-07-11 | Discharge: 2016-07-11 | Disposition: A | Discharge status: Home / Self Care | Payer: 59 | Source: Ambulatory Visit | Attending: Family Medicine | Admitting: Family Medicine

## 2016-07-11 DIAGNOSIS — Z803 Family history of malignant neoplasm of breast: Secondary | ICD-10-CM

## 2016-07-11 DIAGNOSIS — Z1231 Encounter for screening mammogram for malignant neoplasm of breast: Secondary | ICD-10-CM

## 2017-01-04 DIAGNOSIS — M79641 Pain in right hand: Secondary | ICD-10-CM | POA: Diagnosis not present

## 2017-01-04 DIAGNOSIS — J4520 Mild intermittent asthma, uncomplicated: Secondary | ICD-10-CM | POA: Diagnosis not present

## 2017-01-04 DIAGNOSIS — J309 Allergic rhinitis, unspecified: Secondary | ICD-10-CM | POA: Diagnosis not present

## 2017-01-16 DIAGNOSIS — M79644 Pain in right finger(s): Secondary | ICD-10-CM | POA: Diagnosis not present

## 2017-01-16 DIAGNOSIS — M65321 Trigger finger, right index finger: Secondary | ICD-10-CM | POA: Diagnosis not present

## 2017-01-16 DIAGNOSIS — M19041 Primary osteoarthritis, right hand: Secondary | ICD-10-CM | POA: Diagnosis not present

## 2017-02-15 DIAGNOSIS — M19041 Primary osteoarthritis, right hand: Secondary | ICD-10-CM | POA: Diagnosis not present

## 2017-02-15 DIAGNOSIS — M65321 Trigger finger, right index finger: Secondary | ICD-10-CM | POA: Diagnosis not present

## 2017-02-16 DIAGNOSIS — L723 Sebaceous cyst: Secondary | ICD-10-CM | POA: Diagnosis not present

## 2017-06-09 ENCOUNTER — Other Ambulatory Visit: Payer: Self-pay | Admitting: Family Medicine

## 2017-06-09 ENCOUNTER — Other Ambulatory Visit (HOSPITAL_COMMUNITY): Payer: Self-pay | Admitting: Family Medicine

## 2017-06-09 ENCOUNTER — Other Ambulatory Visit (HOSPITAL_COMMUNITY)
Admission: RE | Admit: 2017-06-09 | Discharge: 2017-06-09 | Disposition: A | Discharge status: Home / Self Care | Payer: 59 | Source: Ambulatory Visit | Attending: Family Medicine | Admitting: Family Medicine

## 2017-06-09 DIAGNOSIS — Z124 Encounter for screening for malignant neoplasm of cervix: Secondary | ICD-10-CM | POA: Insufficient documentation

## 2017-06-09 DIAGNOSIS — R109 Unspecified abdominal pain: Secondary | ICD-10-CM | POA: Diagnosis not present

## 2017-06-09 DIAGNOSIS — R8290 Unspecified abnormal findings in urine: Secondary | ICD-10-CM | POA: Diagnosis not present

## 2017-06-13 ENCOUNTER — Ambulatory Visit (HOSPITAL_COMMUNITY): Payer: 59

## 2017-06-14 LAB — CYTOLOGY - PAP: DIAGNOSIS: NEGATIVE

## 2017-06-19 ENCOUNTER — Ambulatory Visit
Admission: RE | Admit: 2017-06-19 | Discharge: 2017-06-19 | Disposition: A | Discharge status: Home / Self Care | Payer: 59 | Source: Ambulatory Visit | Attending: Family Medicine | Admitting: Family Medicine

## 2017-06-19 DIAGNOSIS — R109 Unspecified abdominal pain: Secondary | ICD-10-CM

## 2017-06-19 DIAGNOSIS — R102 Pelvic and perineal pain: Secondary | ICD-10-CM | POA: Diagnosis not present

## 2017-07-26 ENCOUNTER — Other Ambulatory Visit: Payer: Self-pay | Admitting: Family Medicine

## 2017-07-26 DIAGNOSIS — Z1231 Encounter for screening mammogram for malignant neoplasm of breast: Secondary | ICD-10-CM

## 2017-07-27 ENCOUNTER — Ambulatory Visit: Payer: 59

## 2017-07-28 ENCOUNTER — Other Ambulatory Visit: Payer: Self-pay | Admitting: Family Medicine

## 2017-07-28 DIAGNOSIS — Z23 Encounter for immunization: Secondary | ICD-10-CM | POA: Diagnosis not present

## 2017-07-28 DIAGNOSIS — N9489 Other specified conditions associated with female genital organs and menstrual cycle: Secondary | ICD-10-CM

## 2017-07-28 DIAGNOSIS — E7800 Pure hypercholesterolemia, unspecified: Secondary | ICD-10-CM | POA: Diagnosis not present

## 2017-07-28 DIAGNOSIS — Z0000 Encounter for general adult medical examination without abnormal findings: Secondary | ICD-10-CM | POA: Diagnosis not present

## 2017-08-01 ENCOUNTER — Ambulatory Visit
Admission: RE | Admit: 2017-08-01 | Discharge: 2017-08-01 | Disposition: A | Discharge status: Home / Self Care | Payer: 59 | Source: Ambulatory Visit | Attending: Family Medicine | Admitting: Family Medicine

## 2017-08-01 DIAGNOSIS — Z1231 Encounter for screening mammogram for malignant neoplasm of breast: Secondary | ICD-10-CM | POA: Diagnosis not present

## 2017-08-04 ENCOUNTER — Ambulatory Visit
Admission: RE | Admit: 2017-08-04 | Discharge: 2017-08-04 | Disposition: A | Discharge status: Home / Self Care | Payer: 59 | Source: Ambulatory Visit | Attending: Family Medicine | Admitting: Family Medicine

## 2017-08-04 DIAGNOSIS — N9489 Other specified conditions associated with female genital organs and menstrual cycle: Secondary | ICD-10-CM

## 2017-08-09 DIAGNOSIS — N950 Postmenopausal bleeding: Secondary | ICD-10-CM | POA: Diagnosis not present

## 2017-09-11 DIAGNOSIS — L821 Other seborrheic keratosis: Secondary | ICD-10-CM | POA: Diagnosis not present

## 2017-09-11 DIAGNOSIS — Z1283 Encounter for screening for malignant neoplasm of skin: Secondary | ICD-10-CM | POA: Diagnosis not present

## 2017-09-11 DIAGNOSIS — L820 Inflamed seborrheic keratosis: Secondary | ICD-10-CM | POA: Diagnosis not present

## 2017-09-21 DIAGNOSIS — R42 Dizziness and giddiness: Secondary | ICD-10-CM | POA: Diagnosis not present

## 2017-09-26 DIAGNOSIS — J3489 Other specified disorders of nose and nasal sinuses: Secondary | ICD-10-CM | POA: Diagnosis not present

## 2017-09-26 DIAGNOSIS — J309 Allergic rhinitis, unspecified: Secondary | ICD-10-CM | POA: Diagnosis not present

## 2017-09-29 DIAGNOSIS — S4991XA Unspecified injury of right shoulder and upper arm, initial encounter: Secondary | ICD-10-CM | POA: Diagnosis not present

## 2017-12-25 DIAGNOSIS — R0981 Nasal congestion: Secondary | ICD-10-CM | POA: Diagnosis not present

## 2017-12-25 DIAGNOSIS — J309 Allergic rhinitis, unspecified: Secondary | ICD-10-CM | POA: Diagnosis not present

## 2018-03-07 DIAGNOSIS — S70362A Insect bite (nonvenomous), left thigh, initial encounter: Secondary | ICD-10-CM | POA: Diagnosis not present

## 2018-03-07 DIAGNOSIS — W57XXXA Bitten or stung by nonvenomous insect and other nonvenomous arthropods, initial encounter: Secondary | ICD-10-CM | POA: Diagnosis not present

## 2018-03-14 ENCOUNTER — Emergency Department (HOSPITAL_COMMUNITY)
Admission: EM | Admit: 2018-03-14 | Discharge: 2018-03-14 | Disposition: A | Discharge status: Home / Self Care | Payer: 59 | Attending: Emergency Medicine | Admitting: Emergency Medicine

## 2018-03-14 ENCOUNTER — Encounter (HOSPITAL_COMMUNITY): Payer: Self-pay | Admitting: *Deleted

## 2018-03-14 ENCOUNTER — Other Ambulatory Visit: Payer: Self-pay

## 2018-03-14 DIAGNOSIS — Z79899 Other long term (current) drug therapy: Secondary | ICD-10-CM | POA: Diagnosis not present

## 2018-03-14 DIAGNOSIS — J45909 Unspecified asthma, uncomplicated: Secondary | ICD-10-CM | POA: Diagnosis not present

## 2018-03-14 DIAGNOSIS — A09 Infectious gastroenteritis and colitis, unspecified: Secondary | ICD-10-CM | POA: Diagnosis not present

## 2018-03-14 DIAGNOSIS — R1084 Generalized abdominal pain: Secondary | ICD-10-CM | POA: Diagnosis not present

## 2018-03-14 LAB — URINALYSIS, ROUTINE W REFLEX MICROSCOPIC
BACTERIA UA: NONE SEEN
BILIRUBIN URINE: NEGATIVE
Glucose, UA: NEGATIVE mg/dL
Hgb urine dipstick: NEGATIVE
Ketones, ur: NEGATIVE mg/dL
NITRITE: NEGATIVE
Protein, ur: NEGATIVE mg/dL
Specific Gravity, Urine: 1.003 — ABNORMAL LOW (ref 1.005–1.030)
pH: 7 (ref 5.0–8.0)

## 2018-03-14 LAB — COMPREHENSIVE METABOLIC PANEL
ALBUMIN: 4 g/dL (ref 3.5–5.0)
ALT: 29 U/L (ref 14–54)
ANION GAP: 7 (ref 5–15)
AST: 24 U/L (ref 15–41)
Alkaline Phosphatase: 72 U/L (ref 38–126)
BILIRUBIN TOTAL: 0.7 mg/dL (ref 0.3–1.2)
BUN: 6 mg/dL (ref 6–20)
CO2: 29 mmol/L (ref 22–32)
Calcium: 9.2 mg/dL (ref 8.9–10.3)
Chloride: 106 mmol/L (ref 101–111)
Creatinine, Ser: 0.71 mg/dL (ref 0.44–1.00)
GFR calc Af Amer: >60 mL/min (ref >60)
GFR calc non Af Amer: >60 mL/min (ref >60)
GLUCOSE: 88 mg/dL (ref 65–99)
POTASSIUM: 3.9 mmol/L (ref 3.5–5.1)
Sodium: 142 mmol/L (ref 135–145)
TOTAL PROTEIN: 6.7 g/dL (ref 6.5–8.1)

## 2018-03-14 LAB — CBC
HEMATOCRIT: 44.5 % (ref 36.0–46.0)
Hemoglobin: 14.3 g/dL (ref 12.0–15.0)
MCH: 29.9 pg (ref 26.0–34.0)
MCHC: 32.1 g/dL (ref 30.0–36.0)
MCV: 93.1 fL (ref 78.0–100.0)
Platelets: 331 10*3/uL (ref 150–400)
RBC: 4.78 MIL/uL (ref 3.87–5.11)
RDW: 12.3 % (ref 11.5–15.5)
WBC: 8.2 10*3/uL (ref 4.0–10.5)

## 2018-03-14 LAB — LIPASE, BLOOD: LIPASE: 23 U/L (ref 11–51)

## 2018-03-14 MED ORDER — AZITHROMYCIN 250 MG PO TABS: 500.0000 mg | ORAL_TABLET | Freq: Every day | ORAL | 0 refills | Status: AC

## 2018-03-14 MED ORDER — OXYCODONE HCL 5 MG PO TABS
10.0000 mg | ORAL_TABLET | Freq: Once | ORAL | Status: AC
  Administered 2018-03-14: 10 mg via ORAL
  Filled 2018-03-14: qty 2

## 2018-03-14 MED ORDER — ONDANSETRON 4 MG PO TBDP
4.0000 mg | ORAL_TABLET | Freq: Once | ORAL | Status: AC
  Administered 2018-03-14: 4 mg via ORAL
  Filled 2018-03-14: qty 1

## 2018-03-14 MED ORDER — AZITHROMYCIN 250 MG PO TABS
500.0000 mg | ORAL_TABLET | Freq: Once | ORAL | Status: AC
  Administered 2018-03-14: 500 mg via ORAL
  Filled 2018-03-14: qty 2

## 2018-03-14 NOTE — ED Provider Notes (Signed)
[facility] EM[facility]ENCY DEPARTMENT Provider Note   CSN: [id number] Arrival date & time: 03/14/18  1725     History   Chief Complaint Chief Complaint  Patient presents with  . Abdominal Pain    HPI Lindsay Watts is a 62 y.o. female.  62 yo F with a chief complaint of diffuse abdominal pain and diarrhea.  This been going on for the past week.  Patient denies the stool is bright red.  She feels that she has been having more bowel movements recently and the pain is increased.  Describes it as crampy in nature.  Radiates into her low back.  She went to urgent care and was referred here.  She recently saw her family physician for a spider bite and was given a cream to rub on it.  She felt that her symptoms started after that.  She denies suspicious food intake denies recent sick contact but then stated that she had 2 family members with a diarrheal type illness recently.  Close contact with her son and a friend of his that both had a diarrheal illness.  The history is provided by the patient and the spouse.  Abdominal Pain   This is a new problem. The current episode started more than 2 days ago. The problem occurs constantly. The problem has been gradually worsening. The pain is located in the generalized abdominal region. The quality of the pain is sharp, shooting, aching and cramping. The pain is at a severity of 6/10. The pain is moderate. Associated symptoms include diarrhea. Pertinent negatives include fever, nausea, vomiting, dysuria, headaches, arthralgias and myalgias. Nothing aggravates the symptoms. Nothing relieves the symptoms.    Past Medical History:  Diagnosis Date  . Arthritis   . Asthma   . Fibromyalgia     There are no active problems to display for this patient.   History reviewed. No pertinent surgical history.   OB History   None      Home Medications    Prior to Admission medications   Medication Sig Start Date End Date Taking? Authorizing  Provider  albuterol (PROVENTIL HFA;VENTOLIN HFA) 108 (90 BASE) MCG/ACT inhaler Inhale 2 puffs into the lungs every 6 (six) hours as needed. For shortness of breath    [provider]  azithromycin (ZITHROMAX) 250 MG tablet Take 2 tablets (500 mg total) by mouth daily for 2 days. 03/14/18 03/16/18  Deno Etienne, DO  budesonide-formoterol (SYMBICORT) 80-4.5 MCG/ACT inhaler Inhale 2 puffs into the lungs as needed (shortness of breath).     [provider]  diclofenac (VOLTAREN) 50 MG EC tablet Take 50 mg by mouth 2 (two) times daily as needed for mild pain.    [provider]  diphenhydrAMINE (BENADRYL) 25 MG tablet Take 25 mg by mouth at bedtime as needed. For allergies    [provider]  naproxen sodium (ANAPROX) 220 MG tablet Take 220 mg by mouth 2 (two) times daily as needed. For pain    [provider]    Family History No family history on file.  Social History Social History   Tobacco Use  . Smoking status: Never Smoker  . Smokeless tobacco: Never Used  Substance Use Topics  . Alcohol use: No  . Drug use: Not on file     Allergies   Aspirin and Ibuprofen   Review of Systems Review of Systems  Constitutional: Negative for chills and fever.  HENT: Negative for congestion and rhinorrhea.   Eyes:  Negative for redness and visual disturbance.  Respiratory: Negative for shortness of breath and wheezing.   Cardiovascular: Negative for chest pain and palpitations.  Gastrointestinal: Positive for abdominal pain, blood in stool and diarrhea. Negative for nausea and vomiting.  Genitourinary: Negative for dysuria and urgency.  Musculoskeletal: Negative for arthralgias and myalgias.  Skin: Negative for pallor and wound.  Neurological: Negative for dizziness and headaches.     Physical Exam Updated Vital Signs BP (!) 140/91 (BP Location: Right Arm)   Pulse 88   Temp 98.8 F (37.1 C) (Oral)   Resp 16   Ht 5' 2.5" (1.588 m)   Wt 79.4 kg  (175 lb)   SpO2 96%   BMI 31.50 kg/m   Physical Exam  Constitutional: She is oriented to person, place, and time. She appears well-developed and well-nourished. No distress.  HENT:  Head: Normocephalic and atraumatic.  Eyes: Pupils are equal, round, and reactive to light. EOM are normal.  Neck: Normal range of motion. Neck supple.  Cardiovascular: Normal rate and regular rhythm. Exam reveals no gallop and no friction rub.  No murmur heard. Pulmonary/Chest: Effort normal. She has no wheezes. She has no rales.  Abdominal: Soft. She exhibits no distension and no mass. There is tenderness (mild diffuse). There is no guarding.  Musculoskeletal: She exhibits no edema or tenderness.  Neurological: She is alert and oriented to person, place, and time.  Skin: Skin is warm and dry. She is not diaphoretic.  Psychiatric: She has a normal mood and affect. Her behavior is normal.  Nursing note and vitals reviewed.    ED Treatments / Results  Labs (all labs ordered are listed, but only abnormal results are displayed) Labs Reviewed  URINALYSIS, ROUTINE W REFLEX MICROSCOPIC - Abnormal; Notable for the following components:      Result Value   Color, Urine STRAW (*)    Specific Gravity, Urine 1.003 (*)    Leukocytes, UA TRACE (*)    All other components within normal limits  LIPASE, BLOOD  COMPREHENSIVE METABOLIC PANEL  CBC    EKG None  Radiology No results found.  Procedures Procedures (including critical care time)  Medications Ordered in ED Medications  oxyCODONE (Oxy IR/ROXICODONE) immediate release tablet 10 mg (has no administration in time range)  ondansetron (ZOFRAN-ODT) disintegrating tablet 4 mg (has no administration in time range)  azithromycin (ZITHROMAX) tablet 500 mg (has no administration in time range)     Initial Impression / Assessment and Plan / ED Course  I have reviewed the triage vital signs and the nursing notes.  Pertinent labs & imaging results that  were available during my care of the patient were reviewed by me and considered in my medical decision making (see chart for details).     62 yo F with bloody diarrhea.  Going on for four days.  Crampy abdominal pain, no focality on exam.  Labs are reassuring. No leukocytosis, no anemia, no LFT elevation, lipase normal.  Discussed imaging with patient, but will defer with benign labs and no focal abdominal tenderness.  Tolerating PO.  Will treat with abx for possible infectious diarrhea.  PCP follow up.   11:50 AM:  I have discussed the diagnosis/risks/treatment options with the patient and family and believe the pt to be eligible for discharge home to follow-up with PCP. We also discussed returning to the ED immediately if new or worsening sx occur. We discussed the sx which are most concerning (e.g., sudden worsening pain, fever, inability to  tolerate by mouth) that necessitate immediate return. Medications administered to the patient during their visit and any new prescriptions provided to the patient are listed below.  Medications given during this visit Medications  oxyCODONE (Oxy IR/ROXICODONE) immediate release tablet 10 mg (10 mg Oral Given 03/14/18 2143)  ondansetron (ZOFRAN-ODT) disintegrating tablet 4 mg (4 mg Oral Given 03/14/18 2143)  azithromycin (ZITHROMAX) tablet 500 mg (500 mg Oral Given 03/14/18 2144)      The patient appears reasonably screen and/or stabilized for discharge and I doubt any other medical condition or other [facility] requiring further screening, evaluation, or treatment in the ED at this time prior to discharge.    Final Clinical Impressions(s) / ED Diagnoses   Final diagnoses:  Infectious diarrhea in adult patient    ED Discharge Orders        Ordered    azithromycin (ZITHROMAX) 250 MG tablet  Daily     03/14/18 2125       Deno Etienne, DO 03/15/18 1150

## 2018-03-14 NOTE — ED Triage Notes (Signed)
The pt is c/o abd pain and earlier today she has had diarrhea that had bright red blood from her rectum  She was sent here from urgent care

## 2018-03-14 NOTE — ED Notes (Signed)
ED Provider at bedside. 

## 2018-03-14 NOTE — ED Notes (Signed)
Pt reports finding a bite on left inner thigh, it was pussy, and was given a cream by urgent care. Pt reports not feeling well since. Redness still present, no drainage.

## 2018-03-14 NOTE — Discharge Instructions (Signed)
Return for abdominal pain that he can point to with one finger sudden worsening pain inability to eat or drink weakness fatigue.

## 2018-03-15 ENCOUNTER — Emergency Department (HOSPITAL_BASED_OUTPATIENT_CLINIC_OR_DEPARTMENT_OTHER)
Admission: EM | Admit: 2018-03-15 | Discharge: 2018-03-15 | Disposition: A | Discharge status: Home / Self Care | Payer: 59 | Attending: Emergency Medicine | Admitting: Emergency Medicine

## 2018-03-15 ENCOUNTER — Emergency Department (HOSPITAL_BASED_OUTPATIENT_CLINIC_OR_DEPARTMENT_OTHER): Payer: 59

## 2018-03-15 ENCOUNTER — Encounter (HOSPITAL_BASED_OUTPATIENT_CLINIC_OR_DEPARTMENT_OTHER): Payer: Self-pay | Admitting: *Deleted

## 2018-03-15 ENCOUNTER — Other Ambulatory Visit: Payer: Self-pay

## 2018-03-15 DIAGNOSIS — K529 Noninfective gastroenteritis and colitis, unspecified: Secondary | ICD-10-CM | POA: Diagnosis not present

## 2018-03-15 DIAGNOSIS — Z79899 Other long term (current) drug therapy: Secondary | ICD-10-CM | POA: Diagnosis not present

## 2018-03-15 DIAGNOSIS — J45909 Unspecified asthma, uncomplicated: Secondary | ICD-10-CM | POA: Insufficient documentation

## 2018-03-15 DIAGNOSIS — R110 Nausea: Secondary | ICD-10-CM | POA: Diagnosis not present

## 2018-03-15 DIAGNOSIS — R109 Unspecified abdominal pain: Secondary | ICD-10-CM | POA: Diagnosis not present

## 2018-03-15 DIAGNOSIS — R197 Diarrhea, unspecified: Secondary | ICD-10-CM | POA: Diagnosis not present

## 2018-03-15 DIAGNOSIS — R1084 Generalized abdominal pain: Secondary | ICD-10-CM | POA: Diagnosis not present

## 2018-03-15 LAB — COMPREHENSIVE METABOLIC PANEL
ALK PHOS: 72 U/L (ref 38–126)
ALT: 24 U/L (ref 14–54)
ANION GAP: 8 (ref 5–15)
AST: 20 U/L (ref 15–41)
Albumin: 3.7 g/dL (ref 3.5–5.0)
BILIRUBIN TOTAL: 0.9 mg/dL (ref 0.3–1.2)
BUN: 10 mg/dL (ref 6–20)
CALCIUM: 8.8 mg/dL — AB (ref 8.9–10.3)
CO2: 29 mmol/L (ref 22–32)
Chloride: 103 mmol/L (ref 101–111)
Creatinine, Ser: 0.61 mg/dL (ref 0.44–1.00)
Glucose, Bld: 92 mg/dL (ref 65–99)
Potassium: 3.6 mmol/L (ref 3.5–5.1)
SODIUM: 140 mmol/L (ref 135–145)
TOTAL PROTEIN: 6.7 g/dL (ref 6.5–8.1)

## 2018-03-15 LAB — CBC WITH DIFFERENTIAL/PLATELET
Basophils Absolute: 0 10*3/uL (ref 0.0–0.1)
Basophils Relative: 0 %
EOS ABS: 0.1 10*3/uL (ref 0.0–0.7)
Eosinophils Relative: 1 %
HEMATOCRIT: 42.1 % (ref 36.0–46.0)
HEMOGLOBIN: 14.2 g/dL (ref 12.0–15.0)
LYMPHS PCT: 19 %
Lymphs Abs: 2.2 10*3/uL (ref 0.7–4.0)
MCH: 30.7 pg (ref 26.0–34.0)
MCHC: 33.7 g/dL (ref 30.0–36.0)
MCV: 91.1 fL (ref 78.0–100.0)
MONOS PCT: 7 %
Monocytes Absolute: 0.9 10*3/uL (ref 0.1–1.0)
NEUTROS ABS: 8.5 10*3/uL — AB (ref 1.7–7.7)
NEUTROS PCT: 73 %
Platelets: 306 10*3/uL (ref 150–400)
RBC: 4.62 MIL/uL (ref 3.87–5.11)
RDW: 12.6 % (ref 11.5–15.5)
WBC: 11.7 10*3/uL — AB (ref 4.0–10.5)

## 2018-03-15 LAB — LIPASE, BLOOD: Lipase: 23 U/L (ref 11–51)

## 2018-03-15 LAB — OCCULT BLOOD X 1 CARD TO LAB, STOOL: Fecal Occult Bld: POSITIVE — AB

## 2018-03-15 LAB — URINALYSIS, MICROSCOPIC (REFLEX)

## 2018-03-15 LAB — URINALYSIS, ROUTINE W REFLEX MICROSCOPIC
Bilirubin Urine: NEGATIVE
Glucose, UA: NEGATIVE mg/dL
HGB URINE DIPSTICK: NEGATIVE
Ketones, ur: 15 mg/dL — AB
Nitrite: NEGATIVE
PH: 6 (ref 5.0–8.0)
Protein, ur: NEGATIVE mg/dL
SPECIFIC GRAVITY, URINE: 1.01 (ref 1.005–1.030)

## 2018-03-15 LAB — I-STAT CG4 LACTIC ACID, ED: LACTIC ACID, VENOUS: 0.59 mmol/L (ref 0.5–1.9)

## 2018-03-15 MED ORDER — ONDANSETRON 4 MG PO TBDP: 4.0000 mg | ORAL_TABLET | Freq: Three times a day (TID) | ORAL | 0 refills | Status: DC | PRN

## 2018-03-15 MED ORDER — IOPAMIDOL (ISOVUE-300) INJECTION 61%
100.0000 mL | Freq: Once | INTRAVENOUS | Status: AC | PRN
  Administered 2018-03-15: 100 mL via INTRAVENOUS

## 2018-03-15 MED ORDER — METRONIDAZOLE 500 MG PO TABS: 500.0000 mg | ORAL_TABLET | Freq: Three times a day (TID) | ORAL | 0 refills | Status: DC

## 2018-03-15 MED ORDER — ACETAMINOPHEN 325 MG PO TABS
650.0000 mg | ORAL_TABLET | Freq: Once | ORAL | Status: AC
  Administered 2018-03-15: 650 mg via ORAL
  Filled 2018-03-15: qty 2

## 2018-03-15 MED ORDER — ONDANSETRON HCL 4 MG/2ML IJ SOLN
4.0000 mg | Freq: Once | INTRAMUSCULAR | Status: AC
  Administered 2018-03-15: 4 mg via INTRAVENOUS
  Filled 2018-03-15: qty 2

## 2018-03-15 MED ORDER — DICYCLOMINE HCL 20 MG PO TABS: 20.0000 mg | ORAL_TABLET | Freq: Three times a day (TID) | ORAL | 0 refills | Status: DC | PRN

## 2018-03-15 MED ORDER — SODIUM CHLORIDE 0.9 % IV BOLUS
1000.0000 mL | Freq: Once | INTRAVENOUS | Status: AC
  Administered 2018-03-15: 1000 mL via INTRAVENOUS

## 2018-03-15 MED ORDER — CIPROFLOXACIN HCL 500 MG PO TABS: 500.0000 mg | ORAL_TABLET | Freq: Two times a day (BID) | ORAL | 0 refills | Status: DC

## 2018-03-15 NOTE — ED Provider Notes (Signed)
[facility] EMERGENCY DEPARTMENT Provider Note   CSN: [id number] Arrival date & time: 03/15/18  1631     History   Chief Complaint Chief Complaint  Patient presents with  . Abdominal Pain    HPI Lindsay Watts is a 62 y.o. female with a hx of asthma, arthritis, fibromyalgia, appendectomy, and c-section who presents to the ED with complaints of abdominal pain and diarrhea that has been ongoing for the past 4-5 days. Patient states sxs initially started with some gas and diffuse abdominal discomfort. She states gas progressed to watery diarrhea with bright red blood. She states bright red blood in the toilet bowl and sometimes on toilet paper. No mucous noted in the diarrhea. Her abdominal pain has worsened- states it is generalized. She has baseline pain that is a 5/10 in severity, this occasionally increases with cramping to a 10/10 in severity, especially after eating or drinking. She has had associated nausea without vomiting. She has family members who have been sick with diarrhea and crampy abdominal pain as well. She states that she did not consume any suspicious food, she has not had any recent foreign travel, no recent abx use. She has been taking tylenol and imodium without much relief. She was seen at [facility] ER last night- had reassuring labs, and was discharged with dx of infectious diarrhea with prescription for azithromycin which she is taking as prescribed. She called PCP today to get an appointment for re-evaluation- they were unable to fit her in the schedule and she was referred to the ER. She denies chest pain, dyspnea, vomiting, fever, dysuria, vaginal bleeding, or vaginal discharge.    HPI  Past Medical History:  Diagnosis Date  . Arthritis   . Asthma   . Fibromyalgia     There are no active problems to display for this patient.   History reviewed. No pertinent surgical history.   OB History   None      Home Medications    Prior to Admission  medications   Medication Sig Start Date End Date Taking? Authorizing Provider  albuterol (PROVENTIL HFA;VENTOLIN HFA) 108 (90 BASE) MCG/ACT inhaler Inhale 2 puffs into the lungs every 6 (six) hours as needed. For shortness of breath    [provider]  azithromycin (ZITHROMAX) 250 MG tablet Take 2 tablets (500 mg total) by mouth daily for 2 days. 03/14/18 03/16/18  Deno Etienne, DO  budesonide-formoterol (SYMBICORT) 80-4.5 MCG/ACT inhaler Inhale 2 puffs into the lungs as needed (shortness of breath).     [provider]  diclofenac (VOLTAREN) 50 MG EC tablet Take 50 mg by mouth 2 (two) times daily as needed for mild pain.    [provider]  diphenhydrAMINE (BENADRYL) 25 MG tablet Take 25 mg by mouth at bedtime as needed. For allergies    [provider]  naproxen sodium (ANAPROX) 220 MG tablet Take 220 mg by mouth 2 (two) times daily as needed. For pain    [provider]    Family History History reviewed. No pertinent family history.  Social History Social History   Tobacco Use  . Smoking status: Never Smoker  . Smokeless tobacco: Never Used  Substance Use Topics  . Alcohol use: No  . Drug use: Not on file     Allergies   Aspirin and Ibuprofen   Review of Systems Review of Systems  Constitutional: Negative for fever.  Respiratory: Negative for shortness of breath.   Cardiovascular: Negative for chest pain.  Gastrointestinal: Positive for abdominal pain, blood in stool, diarrhea and nausea. Negative for constipation and vomiting.  Genitourinary: Negative for dysuria, vaginal bleeding and vaginal discharge.  All other systems reviewed and are negative.   Physical Exam Updated Vital Signs BP (!) 116/93   Pulse 94   Temp 98.4 F (36.9 C) (Oral)   Resp 16   Ht 5\' 2"  (1.575 m)   Wt 79.4 kg (175 lb)   SpO2 99%   BMI 32.01 kg/m   Physical Exam  Constitutional: She appears well-developed and well-nourished. No distress.  HENT:    Head: Normocephalic and atraumatic.  Eyes: Conjunctivae are normal. Right eye exhibits no discharge. Left eye exhibits no discharge.  Cardiovascular: Normal rate and regular rhythm.  No murmur heard. Pulmonary/Chest: Breath sounds normal. No respiratory distress. She has no wheezes. She has no rales.  Abdominal: Soft. She exhibits no distension. There is generalized tenderness (to light palpation, nonfocal). There is no rigidity, no rebound and no CVA tenderness.  Genitourinary: Rectal exam shows external hemorrhoid (nontender to palpation, non thrombosed) and guaiac positive stool. Rectal exam shows no fissure, no mass and no tenderness. Pelvic exam was performed with patient in the knee-chest position.  Genitourinary Comments: RN present as chaperone.   Neurological: She is alert.  Clear speech.   Skin: Skin is warm and dry. No rash noted.  Psychiatric: She has a normal mood and affect. Her behavior is normal.  Nursing note and vitals reviewed.    ED Treatments / Results  Labs Results for orders placed or performed during the hospital encounter of 03/15/18  Comprehensive metabolic panel  Result Value Ref Range   Sodium 140 135 - 145 mmol/L   Potassium 3.6 3.5 - 5.1 mmol/L   Chloride 103 101 - 111 mmol/L   CO2 29 22 - 32 mmol/L   Glucose, Bld 92 65 - 99 mg/dL   BUN 10 6 - 20 mg/dL   Creatinine, Ser 0.61 0.44 - 1.00 mg/dL   Calcium 8.8 (L) 8.9 - 10.3 mg/dL   Total Protein 6.7 6.5 - 8.1 g/dL   Albumin 3.7 3.5 - 5.0 g/dL   AST 20 15 - 41 U/L   ALT 24 14 - 54 U/L   Alkaline Phosphatase 72 38 - 126 U/L   Total Bilirubin 0.9 0.3 - 1.2 mg/dL   GFR calc non Af Amer >60 >60 mL/min   GFR calc Af Amer >60 >60 mL/min   Anion gap 8 5 - 15  Lipase, blood  Result Value Ref Range   Lipase 23 11 - 51 U/L  CBC with Diff  Result Value Ref Range   WBC 11.7 (H) 4.0 - 10.5 K/uL   RBC 4.62 3.87 - 5.11 MIL/uL   Hemoglobin 14.2 12.0 - 15.0 g/dL   HCT 42.1 36.0 - 46.0 %   MCV 91.1 78.0 -  100.0 fL   MCH 30.7 26.0 - 34.0 pg   MCHC 33.7 30.0 - 36.0 g/dL   RDW 12.6 11.5 - 15.5 %   Platelets 306 150 - 400 K/uL   Neutrophils Relative % 73 %   Neutro Abs 8.5 (H) 1.7 - 7.7 K/uL   Lymphocytes Relative 19 %   Lymphs Abs 2.2 0.7 - 4.0 K/uL   Monocytes Relative 7 %   Monocytes Absolute 0.9 0.1 - 1.0 K/uL   Eosinophils Relative 1 %   Eosinophils Absolute 0.1 0.0 - 0.7 K/uL   Basophils Relative 0 %   Basophils Absolute 0.0  0.0 - 0.1 K/uL  Urinalysis, Routine w reflex microscopic  Result Value Ref Range   Color, Urine YELLOW YELLOW   APPearance HAZY (A) CLEAR   Specific Gravity, Urine 1.010 1.005 - 1.030   pH 6.0 5.0 - 8.0   Glucose, UA NEGATIVE NEGATIVE mg/dL   Hgb urine dipstick NEGATIVE NEGATIVE   Bilirubin Urine NEGATIVE NEGATIVE   Ketones, ur 15 (A) NEGATIVE mg/dL   Protein, ur NEGATIVE NEGATIVE mg/dL   Nitrite NEGATIVE NEGATIVE   Leukocytes, UA MODERATE (A) NEGATIVE  Occult blood card to lab, stool Provider will collect  Result Value Ref Range   Fecal Occult Bld POSITIVE (A) NEGATIVE  Urinalysis, Microscopic (reflex)  Result Value Ref Range   RBC / HPF 0-5 0 - 5 RBC/hpf   WBC, UA 11-20 0 - 5 WBC/hpf   Bacteria, UA MANY (A) NONE SEEN   Squamous Epithelial / LPF 0-5 0 - 5  I-Stat CG4 Lactic Acid, ED  Result Value Ref Range   Lactic Acid, Venous 0.59 0.5 - 1.9 mmol/L    EKG None  Radiology Ct Abdomen Pelvis W Contrast  Result Date: 03/15/2018 CLINICAL DATA:  Abdominal pain, diarrhea, blood in stool EXAM: CT ABDOMEN AND PELVIS WITH CONTRAST TECHNIQUE: Multidetector CT imaging of the abdomen and pelvis was performed using the standard protocol following bolus administration of intravenous contrast. CONTRAST:  148mL ISOVUE-300 IOPAMIDOL (ISOVUE-300) INJECTION 61% COMPARISON:  None. FINDINGS: Lower chest: Lung bases are clear. Hepatobiliary: Liver is within normal limits. Gallbladder is unremarkable. No intrahepatic or extrahepatic ductal dilatation. Pancreas: Within  normal limits. Spleen: Within normal limits. Adrenals/Urinary Tract: Adrenal glands are within normal limits. Tiny left renal cysts measuring up to 5 mm. Right kidney is within normal limits. No hydronephrosis. Bladder is within normal limits. Stomach/Bowel: Stomach is within normal limits. No evidence of bowel obstruction. Appendix is not discretely visualized. Long segment wall thickening extending from the cecum/ascending colon (coronal image 42) to the hepatic flexure (coronal image 38) and proximal transverse colon (coronal image 29). This appearance suggests infectious/inflammatory colitis. No pneumatosis or free air.  No drainable fluid collection/abscess. Vascular/Lymphatic: No evidence of abdominal aortic aneurysm. Celiac artery, SMA, SMV, and IMA remain patent. No suspicious abdominopelvic lymphadenopathy. Reproductive: Uterus is grossly unremarkable. Bilateral ovaries are within normal limits. Other: Small volume pelvic ascites. Musculoskeletal: Mild degenerative changes of the visualized thoracolumbar spine. IMPRESSION: Long segment wall thickening extending from the cecum/ascending colon to the proximal transverse colon, suggesting infectious/inflammatory colitis. Small volume pelvic ascites. No drainable fluid collection/abscess. No pneumatosis or free air. Electronically Signed   By: Julian Hy M.D.   On: 03/15/2018 19:18    Procedures Procedures (including critical care time)  Medications Ordered in ED Medications - No data to display   Initial Impression / Assessment and Plan / ED Course  I have reviewed the triage vital signs and the nursing notes.  Pertinent labs & imaging results that were available during my care of the patient were reviewed by me and considered in my medical decision making (see chart for details).   Patient presents to the ED with abdominal pain and bloody diarrhea. Patient nontoxic appearing, in no apparent distress, vitals without significant  abnormalities. Abdomen with generalized, non focal, tenderness to palpation. DDX: diverticulitis, colitis, obstruction/perforation, pancreatitis, cholecystitis. Will evaluate with labs and CT abdomen/pelvis. Will start fluids and give zofran for nausea, patient requesting tylenol for pain, would like to avoid narcotics.   Labs notable for leukocytosis at 11.7 with left shift. Mild  hypocalcemia at 8.8, otherwise no significant electrolyte abnormalities. LFTs and lipase WNL. Renal fxn WNL. UA appears somewhat concerning for infection, no urinary sxs, will culture. Fecal occult positive, no anemia, patient appears hemodynamically stable. CT abdomen pelvis with findings consistent with infectious/inflammatory colitis. Patient pain improved somewhat, she is tolerating PO in the ER. Will discharge home with cipro/flagyl for colitis, bentyl for pain as patient would like to avoid narcotics, and zofran for nausea/vomiting. I discussed results, treatment plan, need for PCP follow-up, and return precautions with the patient. Provided opportunity for questions, patient confirmed understanding and is in agreement with plan.   Findings and plan of care discussed with supervising physician Dr. Rex Kras who has seen the patient and is in agreement with plan.   Final Clinical Impressions(s) / ED Diagnoses   Final diagnoses:  Colitis    ED Discharge Orders        Ordered    ciprofloxacin (CIPRO) 500 MG tablet  2 times daily     03/15/18 2002    metroNIDAZOLE (FLAGYL) 500 MG tablet  3 times daily     03/15/18 2002    dicyclomine (BENTYL) 20 MG tablet  Every 8 hours PRN     03/15/18 2002    ondansetron (ZOFRAN ODT) 4 MG disintegrating tablet  Every 8 hours PRN     03/15/18 [address] PA-C 03/15/18 2343    Little, Wenda Overland, MD 03/20/18 1711

## 2018-03-15 NOTE — ED Notes (Signed)
Expelled liquid red stool, mixed with urine but grossly bloody without clots. ED Provider informed

## 2018-03-15 NOTE — ED Notes (Signed)
Patient transported to CT 

## 2018-03-15 NOTE — Discharge Instructions (Signed)
You were seen in the ER for abdominal pain and diarrhea. You were diagnosed with colitis, please see the attached hand out for further information regarding this diagnosis. We are sending you home with multiple medications:  - Ciprofloxacin (antibiotic)- take twice daily - Metronidazole (antibiotic)- take three times per day- do not consume alcohol with this medication as it can have severe dangerous side effects.  - Bentyl- take every 8 hours as needed for abdominal cramping.  - Zofran- take every 8 hours as needed for nausea/vomiting- let this dissolve under your tongue  You may continue to take tylenol per over the counter dosing instructions.   We have prescribed you new medication(s) today. Discuss the medications prescribed today with your pharmacist as they can have adverse effects and interactions with your other medicines including over the counter and prescribed medications. Seek medical evaluation if you start to experience new or abnormal symptoms after taking one of these medicines, seek care immediately if you start to experience difficulty breathing, feeling of your throat closing, facial swelling, or rash as these could be indications of a more serious allergic reaction  Please utilize a bland diet, we have provided instructions with recommendations in your discharge instructions.   Follow-up with your primary care provider in 3 days for reevaluation.  Return to the emergency department for any new or worsening symptoms including but not limited to inability to keep food/fluids/medications down, worsening pain, chest pain, trouble breathing, passing out, or any other concerns that you may have.

## 2018-03-15 NOTE — ED Triage Notes (Signed)
Pt c/o abd pain and diarrhea x 1 week , seen at [facility] ED last night for same .

## 2018-03-15 NOTE — ED Notes (Signed)
NAD at this time. Pt is stable and going home.  

## 2018-03-17 LAB — URINE CULTURE

## 2018-03-19 DIAGNOSIS — K529 Noninfective gastroenteritis and colitis, unspecified: Secondary | ICD-10-CM | POA: Diagnosis not present

## 2018-06-25 DIAGNOSIS — R1011 Right upper quadrant pain: Secondary | ICD-10-CM | POA: Diagnosis not present

## 2018-06-25 DIAGNOSIS — K219 Gastro-esophageal reflux disease without esophagitis: Secondary | ICD-10-CM | POA: Diagnosis not present

## 2018-07-02 DIAGNOSIS — D234 Other benign neoplasm of skin of scalp and neck: Secondary | ICD-10-CM | POA: Diagnosis not present

## 2018-07-02 DIAGNOSIS — D170 Benign lipomatous neoplasm of skin and subcutaneous tissue of head, face and neck: Secondary | ICD-10-CM | POA: Diagnosis not present

## 2018-07-02 DIAGNOSIS — L918 Other hypertrophic disorders of the skin: Secondary | ICD-10-CM | POA: Diagnosis not present

## 2018-08-01 DIAGNOSIS — J209 Acute bronchitis, unspecified: Secondary | ICD-10-CM | POA: Diagnosis not present

## 2018-08-01 DIAGNOSIS — J0100 Acute maxillary sinusitis, unspecified: Secondary | ICD-10-CM | POA: Diagnosis not present

## 2018-08-03 ENCOUNTER — Other Ambulatory Visit: Payer: Self-pay | Admitting: Family Medicine

## 2018-08-03 DIAGNOSIS — Z1231 Encounter for screening mammogram for malignant neoplasm of breast: Secondary | ICD-10-CM

## 2018-08-07 ENCOUNTER — Ambulatory Visit
Admission: RE | Admit: 2018-08-07 | Discharge: 2018-08-07 | Disposition: A | Discharge status: Home / Self Care | Payer: 59 | Source: Ambulatory Visit | Attending: Family Medicine | Admitting: Family Medicine

## 2018-08-07 DIAGNOSIS — Z1231 Encounter for screening mammogram for malignant neoplasm of breast: Secondary | ICD-10-CM | POA: Diagnosis not present

## 2018-12-07 ENCOUNTER — Emergency Department (HOSPITAL_BASED_OUTPATIENT_CLINIC_OR_DEPARTMENT_OTHER)
Admission: EM | Admit: 2018-12-07 | Discharge: 2018-12-07 | Disposition: A | Discharge status: Home / Self Care | Payer: No Typology Code available for payment source | Attending: Emergency Medicine | Admitting: Emergency Medicine

## 2018-12-07 ENCOUNTER — Other Ambulatory Visit: Payer: Self-pay

## 2018-12-07 ENCOUNTER — Encounter (HOSPITAL_BASED_OUTPATIENT_CLINIC_OR_DEPARTMENT_OTHER): Payer: Self-pay | Admitting: *Deleted

## 2018-12-07 ENCOUNTER — Emergency Department (HOSPITAL_BASED_OUTPATIENT_CLINIC_OR_DEPARTMENT_OTHER): Payer: No Typology Code available for payment source

## 2018-12-07 DIAGNOSIS — J45909 Unspecified asthma, uncomplicated: Secondary | ICD-10-CM | POA: Insufficient documentation

## 2018-12-07 DIAGNOSIS — Z79899 Other long term (current) drug therapy: Secondary | ICD-10-CM | POA: Insufficient documentation

## 2018-12-07 DIAGNOSIS — K5732 Diverticulitis of large intestine without perforation or abscess without bleeding: Secondary | ICD-10-CM | POA: Diagnosis not present

## 2018-12-07 DIAGNOSIS — R1033 Periumbilical pain: Secondary | ICD-10-CM | POA: Diagnosis present

## 2018-12-07 LAB — COMPREHENSIVE METABOLIC PANEL
ALBUMIN: 4.5 g/dL (ref 3.5–5.0)
ALK PHOS: 78 U/L (ref 38–126)
ALT: 19 U/L (ref 0–44)
ANION GAP: 8 (ref 5–15)
AST: 20 U/L (ref 15–41)
BILIRUBIN TOTAL: 0.7 mg/dL (ref 0.3–1.2)
BUN: 19 mg/dL (ref 8–23)
CO2: 24 mmol/L (ref 22–32)
Calcium: 8.9 mg/dL (ref 8.9–10.3)
Chloride: 102 mmol/L (ref 98–111)
Creatinine, Ser: 0.66 mg/dL (ref 0.44–1.00)
GFR calc Af Amer: >60 mL/min (ref >60)
GFR calc non Af Amer: >60 mL/min (ref >60)
GLUCOSE: 103 mg/dL — AB (ref 70–99)
Potassium: 3.5 mmol/L (ref 3.5–5.1)
Sodium: 134 mmol/L — ABNORMAL LOW (ref 135–145)
TOTAL PROTEIN: 7.3 g/dL (ref 6.5–8.1)

## 2018-12-07 LAB — CBC WITH DIFFERENTIAL/PLATELET
Abs Immature Granulocytes: 0.03 10*3/uL (ref 0.00–0.07)
BASOS ABS: 0 10*3/uL (ref 0.0–0.1)
BASOS PCT: 0 %
EOS ABS: 0.3 10*3/uL (ref 0.0–0.5)
EOS PCT: 3 %
HCT: 43.2 % (ref 36.0–46.0)
Hemoglobin: 14 g/dL (ref 12.0–15.0)
IMMATURE GRANULOCYTES: 0 %
Lymphocytes Relative: 14 %
Lymphs Abs: 1.7 10*3/uL (ref 0.7–4.0)
MCH: 30.1 pg (ref 26.0–34.0)
MCHC: 32.4 g/dL (ref 30.0–36.0)
MCV: 92.9 fL (ref 80.0–100.0)
MONOS PCT: 6 %
Monocytes Absolute: 0.8 10*3/uL (ref 0.1–1.0)
NEUTROS PCT: 77 %
NRBC: 0 % (ref 0.0–0.2)
Neutro Abs: 9.5 10*3/uL — ABNORMAL HIGH (ref 1.7–7.7)
Platelets: 354 10*3/uL (ref 150–400)
RBC: 4.65 MIL/uL (ref 3.87–5.11)
RDW: 11.9 % (ref 11.5–15.5)
WBC: 12.2 10*3/uL — ABNORMAL HIGH (ref 4.0–10.5)

## 2018-12-07 LAB — URINALYSIS, ROUTINE W REFLEX MICROSCOPIC
Bilirubin Urine: NEGATIVE
GLUCOSE, UA: NEGATIVE mg/dL
Hgb urine dipstick: NEGATIVE
Ketones, ur: 15 mg/dL — AB
LEUKOCYTE UA: NEGATIVE
Nitrite: NEGATIVE
Protein, ur: NEGATIVE mg/dL
Specific Gravity, Urine: <1.005 — ABNORMAL LOW (ref 1.005–1.030)
pH: 6 (ref 5.0–8.0)

## 2018-12-07 LAB — LIPASE, BLOOD: Lipase: 32 U/L (ref 11–51)

## 2018-12-07 MED ORDER — HYDROCODONE-ACETAMINOPHEN 5-325 MG PO TABS: 1.0000 | ORAL_TABLET | Freq: Four times a day (QID) | ORAL | 0 refills | Status: DC | PRN

## 2018-12-07 MED ORDER — HYDROCODONE-ACETAMINOPHEN 5-325 MG PO TABS
ORAL_TABLET | ORAL | Status: AC
  Filled 2018-12-07: qty 1

## 2018-12-07 MED ORDER — AMOXICILLIN-POT CLAVULANATE 875-125 MG PO TABS: 1.0000 | ORAL_TABLET | Freq: Two times a day (BID) | ORAL | 0 refills | Status: DC

## 2018-12-07 MED ORDER — IOHEXOL 300 MG/ML  SOLN
100.0000 mL | Freq: Once | INTRAMUSCULAR | Status: AC | PRN
  Administered 2018-12-07: 100 mL via INTRAVENOUS

## 2018-12-07 MED ORDER — HYDROCODONE-ACETAMINOPHEN 5-325 MG PO TABS
1.0000 | ORAL_TABLET | Freq: Once | ORAL | Status: AC
  Administered 2018-12-07: 1 via ORAL

## 2018-12-07 MED ORDER — FENTANYL CITRATE (PF) 100 MCG/2ML IJ SOLN
100.0000 ug | Freq: Once | INTRAMUSCULAR | Status: AC
  Administered 2018-12-07: 100 ug via INTRAVENOUS
  Filled 2018-12-07: qty 2

## 2018-12-07 MED ORDER — AMOXICILLIN-POT CLAVULANATE 875-125 MG PO TABS
1.0000 | ORAL_TABLET | Freq: Once | ORAL | Status: AC
  Administered 2018-12-07: 1 via ORAL
  Filled 2018-12-07: qty 1

## 2018-12-07 MED ORDER — ONDANSETRON 8 MG PO TBDP: 8.0000 mg | ORAL_TABLET | Freq: Three times a day (TID) | ORAL | 0 refills | Status: DC | PRN

## 2018-12-07 MED ORDER — ONDANSETRON HCL 4 MG/2ML IJ SOLN
4.0000 mg | Freq: Once | INTRAMUSCULAR | Status: AC
  Administered 2018-12-07: 4 mg via INTRAVENOUS
  Filled 2018-12-07: qty 2

## 2018-12-07 NOTE — ED Provider Notes (Signed)
[facility] DEPT [facility] Provider Note: Georgena Spurling, MD, FACEP  CSN: [id number] MRN: [id number] ARRIVAL: 12/07/18 at [place]: [place]  Abdominal Pain   HISTORY OF PRESENT ILLNESS  12/07/18 1:12 AM Lindsay Watts is a 63 y.o. female with a one-week history of abdominal pain.  The pain is primarily periumbilical but is also felt in the epigastrium in the left lower quadrant.  She describes it as burning and rates it as a 10 out of 10 now.  It has acutely worsened over the past day.  She has had associated nausea but no vomiting or diarrhea.  She has not had a fever.  Pain is worse with eating or palpation, better with standing.   Past Medical History:  Diagnosis Date  . Arthritis   . Asthma   . Fibromyalgia     History reviewed. No pertinent surgical history.  History reviewed. No pertinent family history.  Social History   Tobacco Use  . Smoking status: Never Smoker  . Smokeless tobacco: Never Used  Substance Use Topics  . Alcohol use: No  . Drug use: Not on file    Prior to Admission medications   Medication Sig Start Date End Date Taking? Authorizing Provider  albuterol (PROVENTIL HFA;VENTOLIN HFA) 108 (90 BASE) MCG/ACT inhaler Inhale 2 puffs into the lungs every 6 (six) hours as needed. For shortness of breath    [provider]  budesonide-formoterol (SYMBICORT) 80-4.5 MCG/ACT inhaler Inhale 2 puffs into the lungs as needed (shortness of breath).     [provider]  ciprofloxacin (CIPRO) 500 MG tablet Take 1 tablet (500 mg total) by mouth 2 (two) times daily. 03/15/18   Petrucelli, Samantha R, PA-C  diclofenac (VOLTAREN) 50 MG EC tablet Take 50 mg by mouth 2 (two) times daily as needed for mild pain.    [provider]  dicyclomine (BENTYL) 20 MG tablet Take 1 tablet (20 mg total) by mouth every 8 (eight) hours as needed for spasms (abdominal pain). 03/15/18   Petrucelli, Samantha R, PA-C  diphenhydrAMINE (BENADRYL) 25 MG  tablet Take 25 mg by mouth at bedtime as needed. For allergies    [provider]  metroNIDAZOLE (FLAGYL) 500 MG tablet Take 1 tablet (500 mg total) by mouth 3 (three) times daily. 03/15/18   Petrucelli, Samantha R, PA-C  naproxen sodium (ANAPROX) 220 MG tablet Take 220 mg by mouth 2 (two) times daily as needed. For pain    [provider]  ondansetron (ZOFRAN ODT) 4 MG disintegrating tablet Take 1 tablet (4 mg total) by mouth every 8 (eight) hours as needed for nausea or vomiting. 03/15/18   Petrucelli, Samantha R, PA-C    Allergies Aspirin and Ibuprofen   REVIEW OF SYSTEMS  Negative except as noted here or in the History of Present Illness.   PHYSICAL EXAMINATION  Initial Vital Signs Blood pressure 138/88, pulse (!) 102, temperature 98.3 F (36.8 C), temperature source Oral, resp. rate 20, height 5\' 3"  (1.6 m), weight 80.7 kg, SpO2 98 %.  Examination General: Well-developed, well-nourished female in no acute distress; appearance consistent with age of record HENT: normocephalic; atraumatic Eyes: pupils equal, round and reactive to light; extraocular muscles intact Neck: supple Heart: regular rate and rhythm Lungs: clear to auscultation bilaterally Abdomen: soft; nondistended; periumbilical and left lower quadrant tenderness; no masses or hepatosplenomegaly; bowel sounds present Extremities: No deformity; full range of motion; pulses normal Neurologic: Awake, alert and oriented; motor function intact in all  extremities and symmetric; no facial droop Skin: Warm and dry Psychiatric: Normal mood and affect   RESULTS  Summary of this visit's results, reviewed by myself:   EKG Interpretation  Date/Time:    Ventricular Rate:    PR Interval:    QRS Duration:   QT Interval:    QTC Calculation:   R Axis:     Text Interpretation:        Laboratory Studies: Results for orders placed or performed during the hospital encounter of 12/07/18 (from the past 24 hour(s))   CBC with Differential/Platelet     Status: Abnormal   Collection Time: 12/07/18  1:19 AM  Result Value Ref Range   WBC 12.2 (H) 4.0 - 10.5 K/uL   RBC 4.65 3.87 - 5.11 MIL/uL   Hemoglobin 14.0 12.0 - 15.0 g/dL   HCT 43.2 36.0 - 46.0 %   MCV 92.9 80.0 - 100.0 fL   MCH 30.1 26.0 - 34.0 pg   MCHC 32.4 30.0 - 36.0 g/dL   RDW 11.9 11.5 - 15.5 %   Platelets 354 150 - 400 K/uL   nRBC 0.0 0.0 - 0.2 %   Neutrophils Relative % 77 %   Neutro Abs 9.5 (H) 1.7 - 7.7 K/uL   Lymphocytes Relative 14 %   Lymphs Abs 1.7 0.7 - 4.0 K/uL   Monocytes Relative 6 %   Monocytes Absolute 0.8 0.1 - 1.0 K/uL   Eosinophils Relative 3 %   Eosinophils Absolute 0.3 0.0 - 0.5 K/uL   Basophils Relative 0 %   Basophils Absolute 0.0 0.0 - 0.1 K/uL   Immature Granulocytes 0 %   Abs Immature Granulocytes 0.03 0.00 - 0.07 K/uL  Lipase, blood     Status: None   Collection Time: 12/07/18  1:19 AM  Result Value Ref Range   Lipase 32 11 - 51 U/L  Comprehensive metabolic panel     Status: Abnormal   Collection Time: 12/07/18  1:19 AM  Result Value Ref Range   Sodium 134 (L) 135 - 145 mmol/L   Potassium 3.5 3.5 - 5.1 mmol/L   Chloride 102 98 - 111 mmol/L   CO2 24 22 - 32 mmol/L   Glucose, Bld 103 (H) 70 - 99 mg/dL   BUN 19 8 - 23 mg/dL   Creatinine, Ser 0.66 0.44 - 1.00 mg/dL   Calcium 8.9 8.9 - 10.3 mg/dL   Total Protein 7.3 6.5 - 8.1 g/dL   Albumin 4.5 3.5 - 5.0 g/dL   AST 20 15 - 41 U/L   ALT 19 0 - 44 U/L   Alkaline Phosphatase 78 38 - 126 U/L   Total Bilirubin 0.7 0.3 - 1.2 mg/dL   GFR calc non Af Amer >60 >60 mL/min   GFR calc Af Amer >60 >60 mL/min   Anion gap 8 5 - 15  Urinalysis, Routine w reflex microscopic     Status: Abnormal   Collection Time: 12/07/18  2:53 AM  Result Value Ref Range   Color, Urine STRAW (A) YELLOW   APPearance CLEAR CLEAR   Specific Gravity, Urine <1.005 (L) 1.005 - 1.030   pH 6.0 5.0 - 8.0   Glucose, UA NEGATIVE NEGATIVE mg/dL   Hgb urine dipstick NEGATIVE NEGATIVE    Bilirubin Urine NEGATIVE NEGATIVE   Ketones, ur 15 (A) NEGATIVE mg/dL   Protein, ur NEGATIVE NEGATIVE mg/dL   Nitrite NEGATIVE NEGATIVE   Leukocytes,Ua NEGATIVE NEGATIVE   Imaging Studies: Ct Abdomen Pelvis W Contrast  Result Date: 12/07/2018 CLINICAL DATA:  Mid abdominal pain and burning sensation for 1 week. History of colitis and appendectomy. EXAM: CT ABDOMEN AND PELVIS WITH CONTRAST TECHNIQUE: Multidetector CT imaging of the abdomen and pelvis was performed using the standard protocol following bolus administration of intravenous contrast. CONTRAST:  139mL OMNIPAQUE IOHEXOL 300 MG/ML  SOLN COMPARISON:  CT abdomen and pelvis March 15, 2018 FINDINGS: LOWER CHEST: RIGHT lung base atelectasis.  LEFT lung is clear. HEPATOBILIARY: Liver and gallbladder are normal. PANCREAS: Normal. SPLEEN: Normal. ADRENALS/URINARY TRACT: Kidneys are orthotopic, demonstrating symmetric enhancement. No nephrolithiasis, hydronephrosis or solid renal masses. Too small to characterize hypodensities bilateral kidneys. The unopacified ureters are normal in course and caliber. Delayed imaging through the kidneys demonstrates symmetric prompt contrast excretion within the proximal urinary collecting system. Urinary bladder is partially distended and unremarkable. Normal adrenal glands. STOMACH/BOWEL: Very small hiatal hernia. Mild colonic diverticulosis. Superimposed short segment of descending colonic bowel wall thickening and pericolonic inflammation associated with diverticulum. 2.8 cm cyst diameter small bowel LEFT lower quadrant associated wall thickening and small bowel feces. VASCULAR/LYMPHATIC: Aortoiliac vessels are normal in course and caliber. No lymphadenopathy by CT size criteria. REPRODUCTIVE: Normal. OTHER: Small volume free fluid in the pelvis is likely reactive. No intraperitoneal free air fluid collections. MUSCULOSKELETAL: Nonacute. Moderate fat containing periumbilical hernias. Severe lower lumbar facet arthropathy.  Minimal grade 1 L4-5 anterolisthesis without spondylolysis. IMPRESSION: 1. Acute uncomplicated descending colonic diverticulitis. 2. LEFT lower quadrant small bowel ileus. Electronically Signed   By: Elon Alas M.D.   On: 12/07/2018 02:57    ED COURSE and MDM  Nursing notes and initial vitals signs, including pulse oximetry, reviewed.  Vitals:   12/07/18 0109 12/07/18 0110  BP: 138/88   Pulse: (!) 102   Resp: 20   Temp: 98.3 F (36.8 C)   TempSrc: Oral   SpO2: 98%   Weight:  80.7 kg  Height:  5\' 3"  (1.6 m)    PROCEDURES    ED DIAGNOSES     ICD-10-CM   1. Sigmoid diverticulitis K57.32        Elder Davidian, Jenny Reichmann, MD 12/07/18 [phone]

## 2018-12-07 NOTE — ED Triage Notes (Signed)
Pt reports mid abdomen pain 'burning' sensation x 1 week.  Worsening tonight.  Reports hx of colitis-states that it does not feel the same.  Pt very uncomfortable, unable to sit or lay.  Nausea without vomiting, denies diarrhea.

## 2018-12-07 NOTE — ED Notes (Signed)
Pt to CT scan.

## 2019-08-07 ENCOUNTER — Other Ambulatory Visit: Payer: Self-pay | Admitting: Family Medicine

## 2019-08-07 DIAGNOSIS — Z1231 Encounter for screening mammogram for malignant neoplasm of breast: Secondary | ICD-10-CM

## 2019-08-24 IMAGING — MG DIGITAL SCREENING BILATERAL MAMMOGRAM WITH CAD
4 series · 4 of 4 positions shown · non-contrast
Comparison: Previous exam(s).

CLINICAL DATA: Screening.

EXAM:
DIGITAL SCREENING BILATERAL MAMMOGRAM WITH CAD

[R CC]
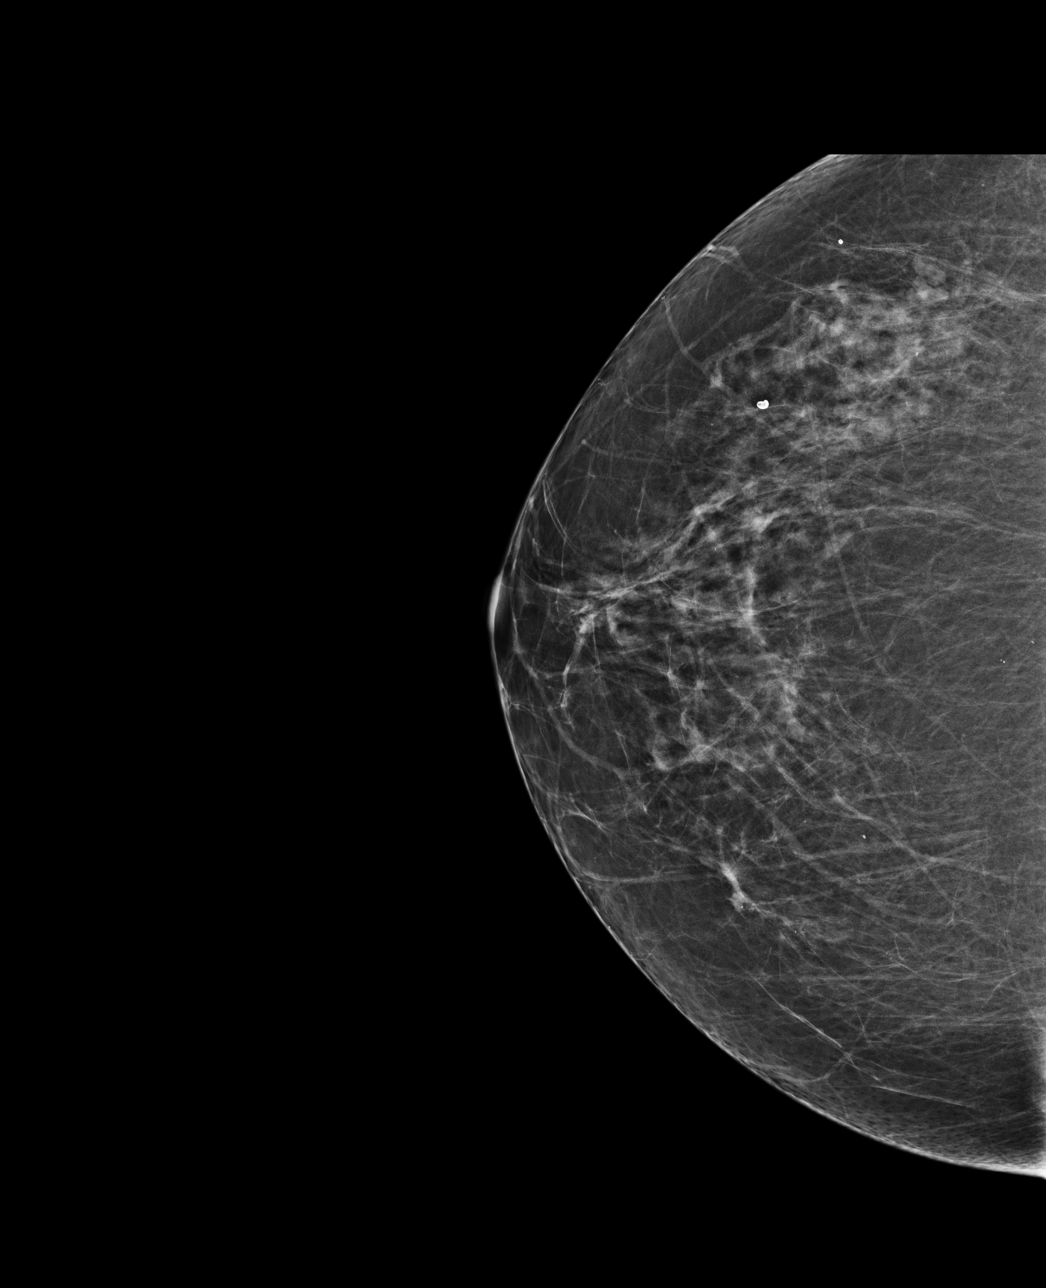

[L CC]
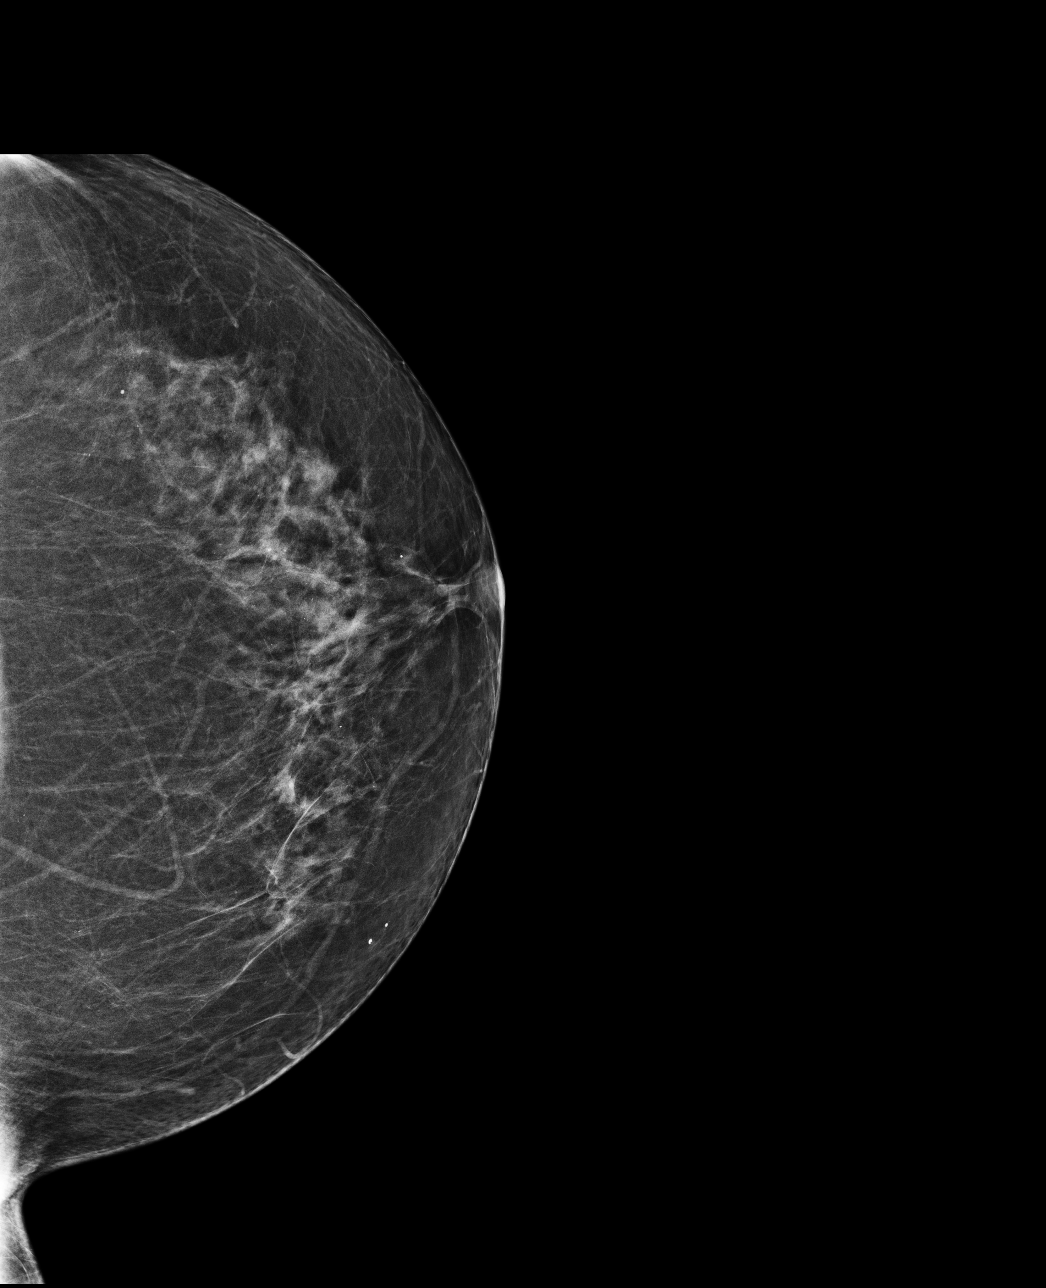

[L MLO]
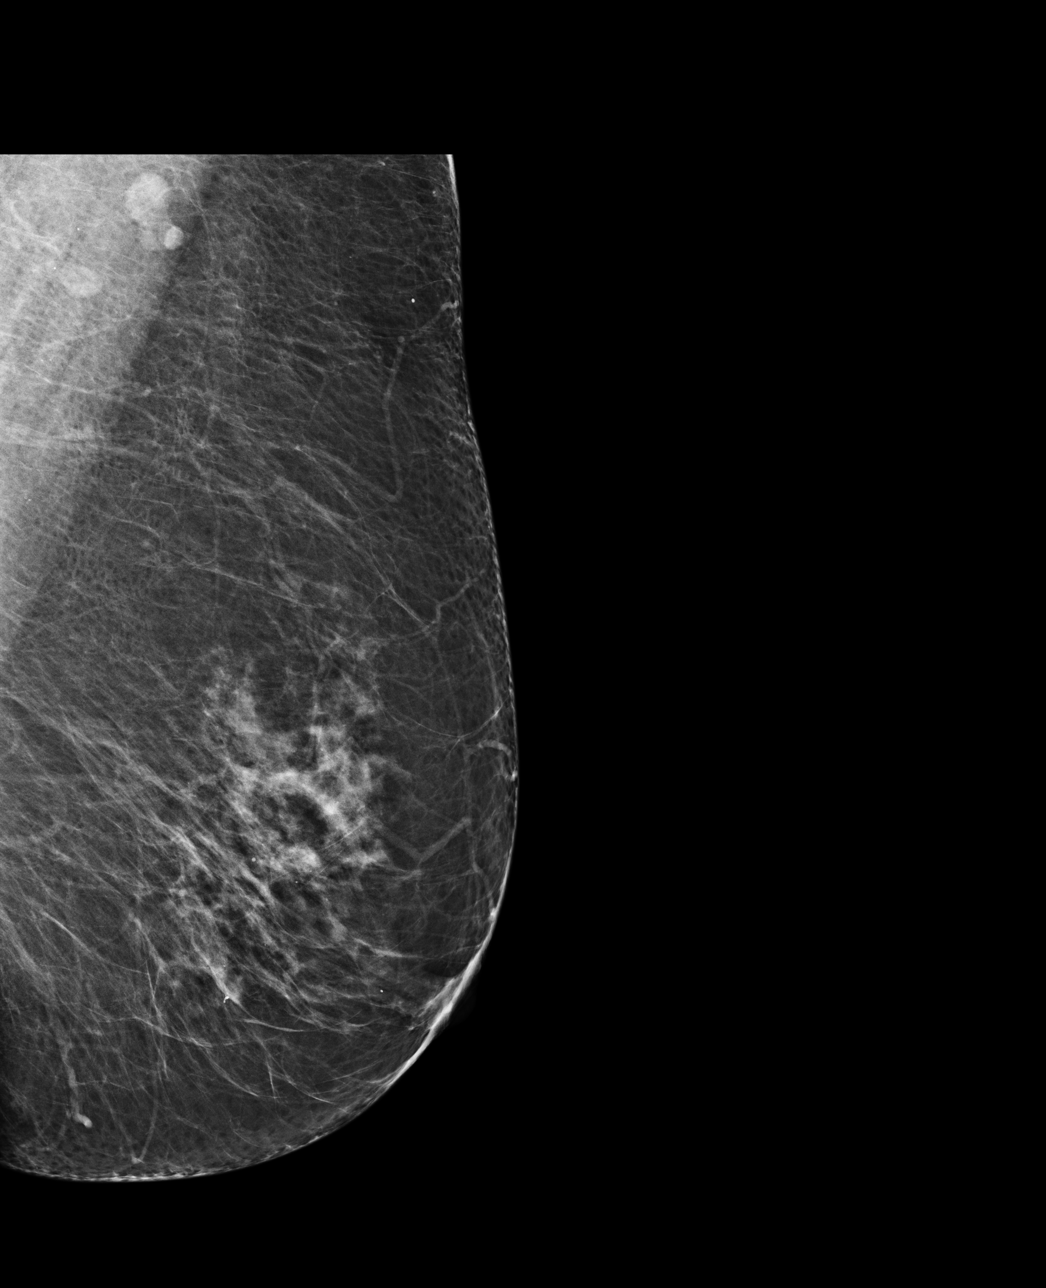

[R MLO]
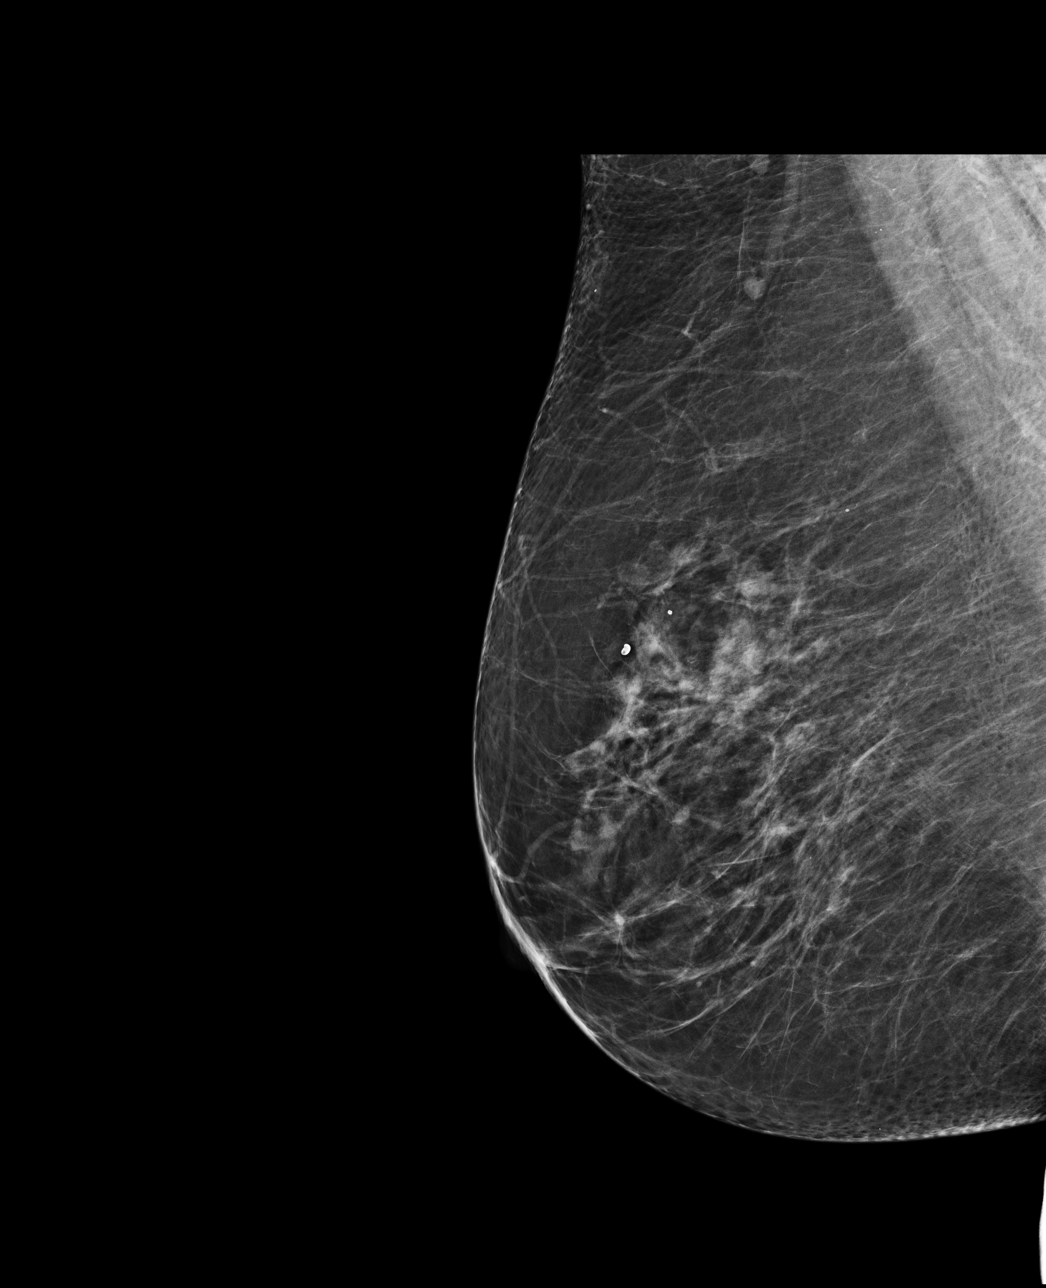

[4 of 4 positions shown; findings below may reference images not displayed]

ACR Breast Density Category b: There are scattered areas of
fibroglandular density.
FINDINGS: There are no findings suspicious for malignancy. Images were
processed with CAD.
IMPRESSION: No mammographic evidence of malignancy. A result letter of this
screening mammogram will be mailed directly to the patient.

RECOMMENDATION:
Screening mammogram in one year. (Code:AS-G-LCT)

BI-RADS CATEGORY  1: Negative.

## 2019-08-27 IMAGING — US US PELVIS COMPLETE TRANSABD/TRANSVAG
1 series · 13 of 24 positions shown · non-contrast
Comparison: 06/19/2017

ADDENDUM:
It should be noted the actual endometrial thickness is minimal. The
majority of the 8 mm described is related to the fluid within the
endometrial cavity.
CLINICAL DATA: Follow-up endometrial fluid



[Series 1: us pelvis complete transabd/transvag · 0.31mm/px · 13 of 24 slices shown]
[im 1/24]
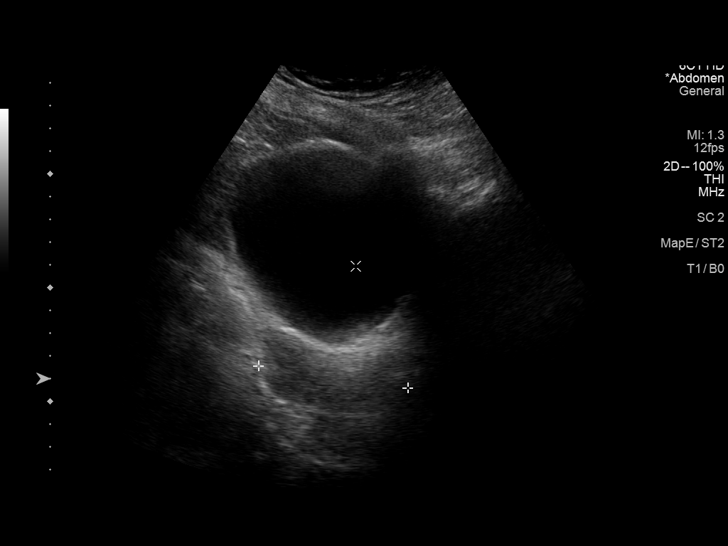
[im 3/24]
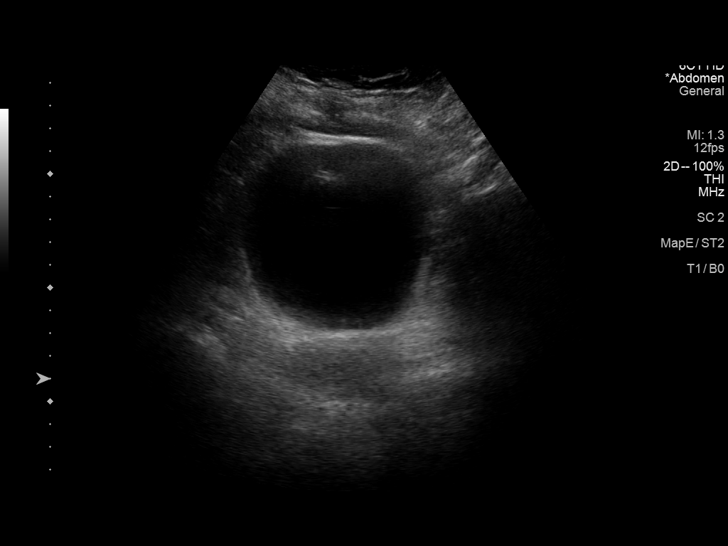
[im 5/24]
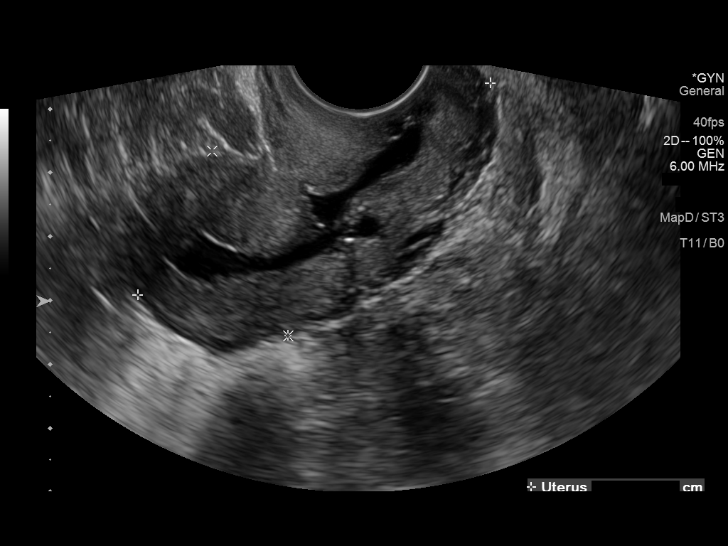
[im 7/24]
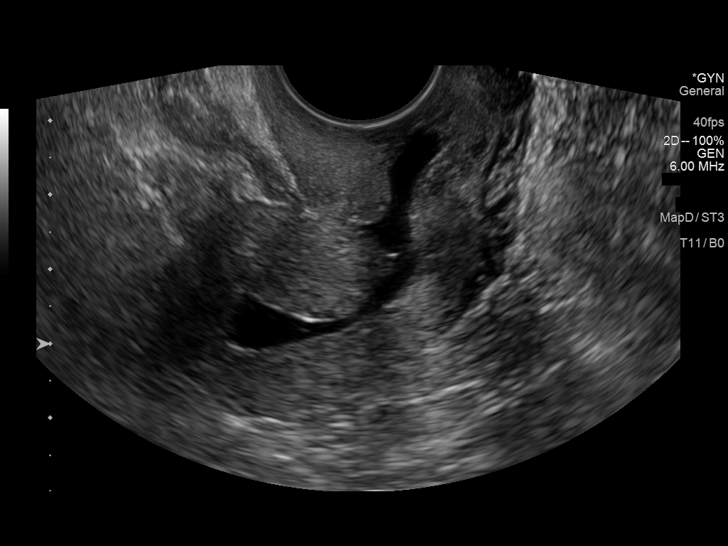
[im 9/24]
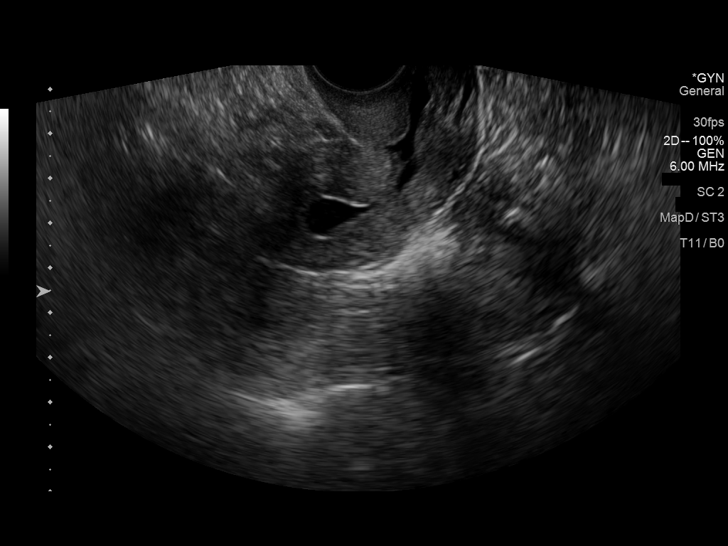
[im 11/24]
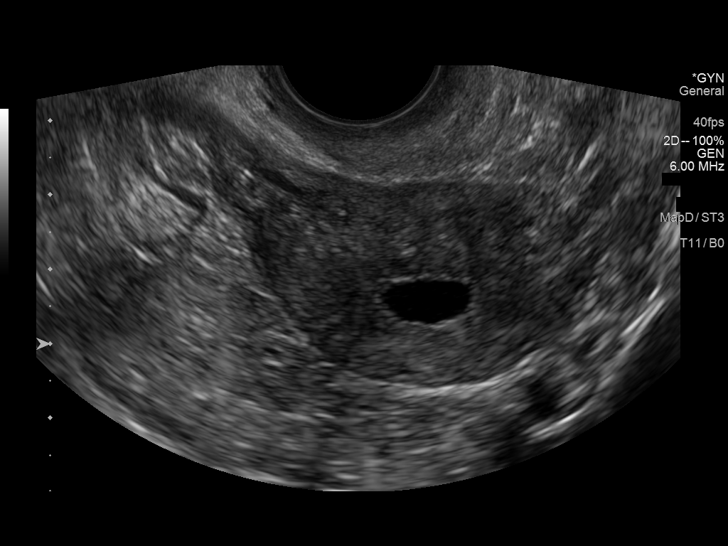
[im 13/24]
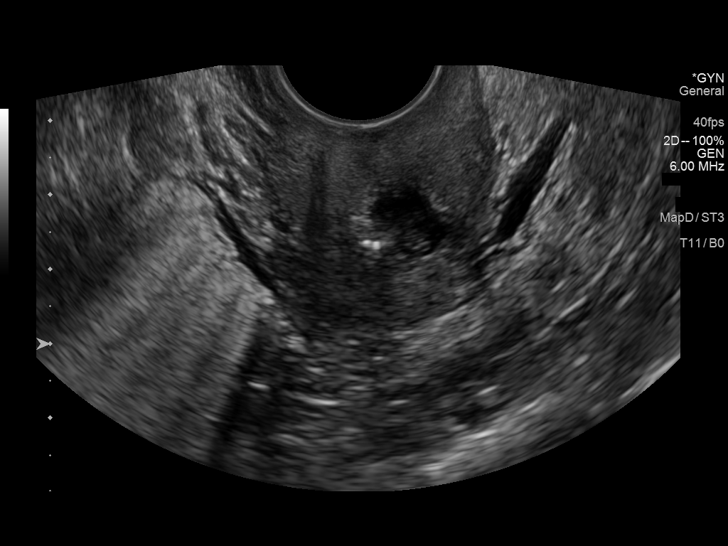
[im 14/24]
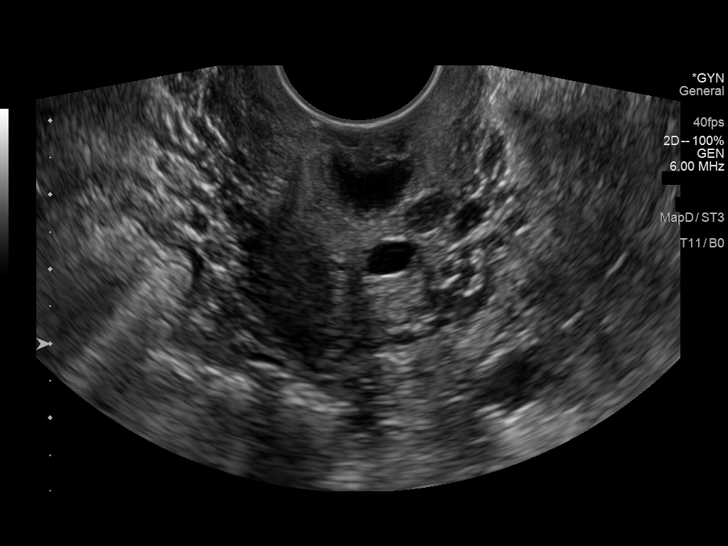
[im 16/24]
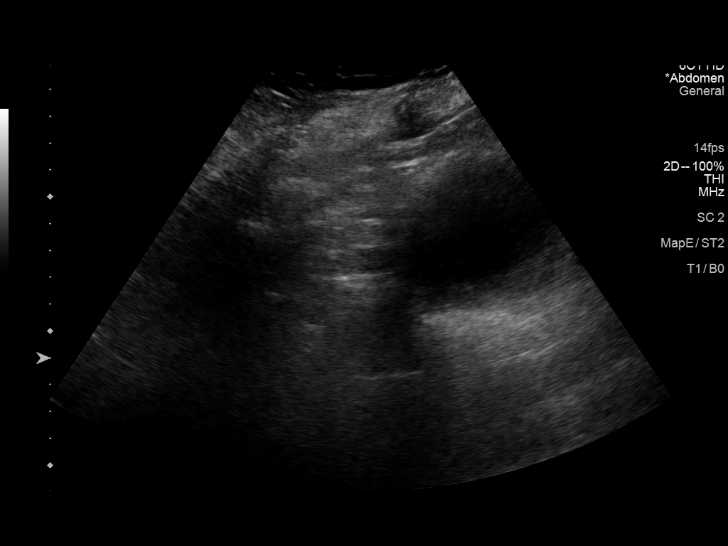
[im 18/24]
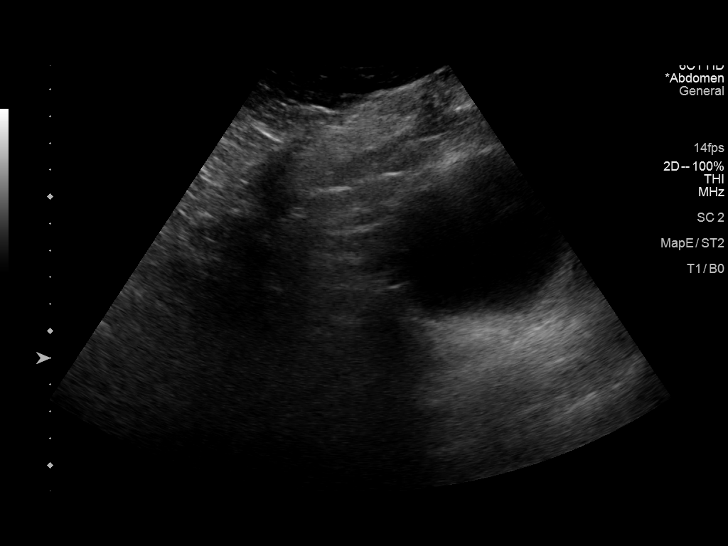
[im 20/24]
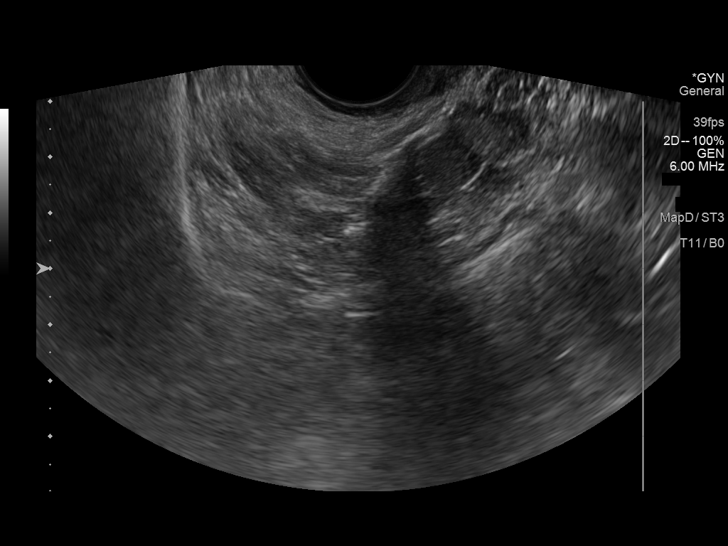
[im 22/24]
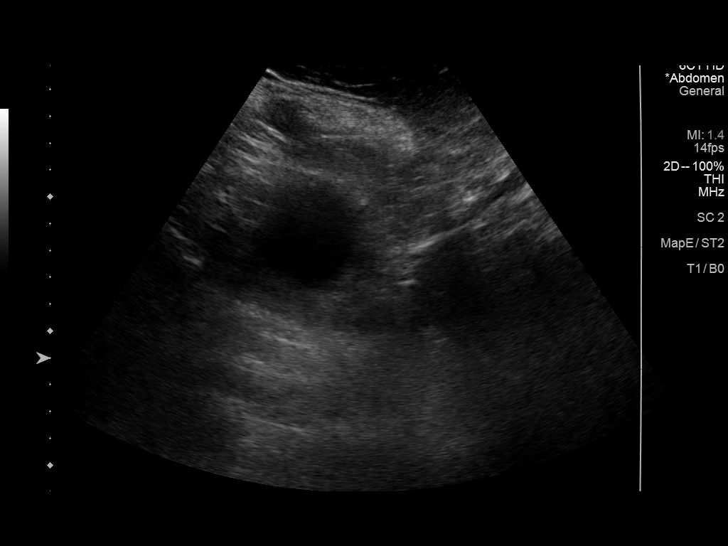
[im 24/24]
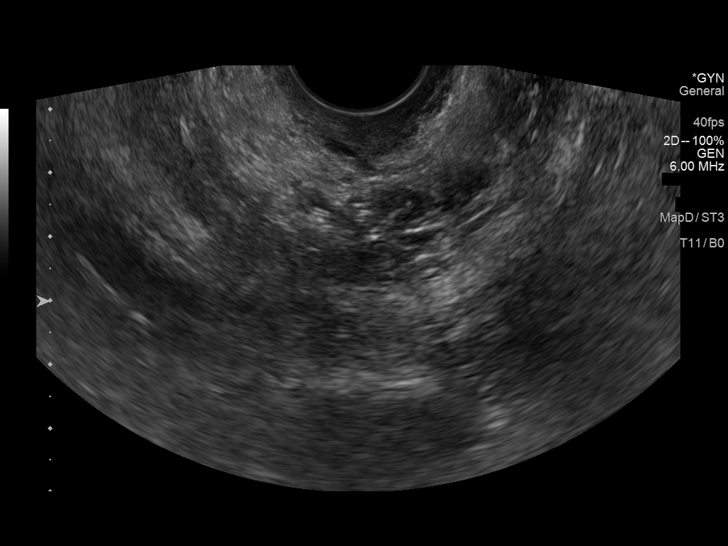

[13 of 24 positions shown; findings below may reference images not displayed]

FINDINGS: Uterus

Measurements: 6.6 x 2.8 x 4.8 cm. No fibroids or other mass
visualized.

Endometrium

Thickness: 8 mm. Fluid is noted throughout the endometrial cavity
and extending through the cervix. This is an increase when compared
with the prior exam.

Right ovary

Not well visualized

Left ovary

Not well visualized

Other findings

No abnormal free fluid.
IMPRESSION: Increase in the amount of endometrial fluid when compared with the
prior exam. Given the patient's postmenopausal state this is felt to
be somewhat suspicious and further evaluation is recommended.

Nonvisualization of the adnexal.

## 2019-09-26 ENCOUNTER — Ambulatory Visit: Payer: No Typology Code available for payment source

## 2020-06-05 ENCOUNTER — Other Ambulatory Visit: Payer: Self-pay

## 2020-06-05 DIAGNOSIS — Z0000 Encounter for general adult medical examination without abnormal findings: Secondary | ICD-10-CM

## 2020-06-18 ENCOUNTER — Ambulatory Visit
Admission: RE | Admit: 2020-06-18 | Discharge: 2020-06-18 | Disposition: A | Discharge status: Home / Self Care | Payer: No Typology Code available for payment source | Source: Ambulatory Visit | Attending: Family Medicine | Admitting: Family Medicine

## 2020-06-18 ENCOUNTER — Other Ambulatory Visit: Payer: Self-pay

## 2020-06-18 DIAGNOSIS — Z0000 Encounter for general adult medical examination without abnormal findings: Secondary | ICD-10-CM

## 2020-06-25 ENCOUNTER — Other Ambulatory Visit (HOSPITAL_COMMUNITY)
Admission: RE | Admit: 2020-06-25 | Discharge: 2020-06-25 | Disposition: A | Discharge status: Home / Self Care | Payer: No Typology Code available for payment source | Source: Ambulatory Visit | Attending: Family Medicine | Admitting: Family Medicine

## 2020-06-25 DIAGNOSIS — Z124 Encounter for screening for malignant neoplasm of cervix: Secondary | ICD-10-CM | POA: Insufficient documentation

## 2020-06-29 LAB — CYTOLOGY - PAP: Diagnosis: NEGATIVE

## 2022-03-31 ENCOUNTER — Other Ambulatory Visit: Payer: Self-pay | Admitting: Family Medicine

## 2022-03-31 DIAGNOSIS — Z1231 Encounter for screening mammogram for malignant neoplasm of breast: Secondary | ICD-10-CM

## 2022-06-20 ENCOUNTER — Other Ambulatory Visit: Payer: Self-pay | Admitting: Family Medicine

## 2022-06-20 DIAGNOSIS — Z1231 Encounter for screening mammogram for malignant neoplasm of breast: Secondary | ICD-10-CM

## 2022-06-29 ENCOUNTER — Other Ambulatory Visit: Payer: Self-pay | Admitting: Family Medicine

## 2022-06-29 DIAGNOSIS — Z13820 Encounter for screening for osteoporosis: Secondary | ICD-10-CM

## 2022-07-05 ENCOUNTER — Ambulatory Visit
Admission: RE | Admit: 2022-07-05 | Discharge: 2022-07-05 | Disposition: A | Discharge status: Home / Self Care | Payer: Medicare Other | Source: Ambulatory Visit | Attending: Family Medicine | Admitting: Family Medicine

## 2022-07-05 DIAGNOSIS — Z1231 Encounter for screening mammogram for malignant neoplasm of breast: Secondary | ICD-10-CM

## 2022-07-11 ENCOUNTER — Other Ambulatory Visit: Payer: Self-pay | Admitting: Family Medicine

## 2022-07-11 DIAGNOSIS — E7800 Pure hypercholesterolemia, unspecified: Secondary | ICD-10-CM

## 2022-08-15 ENCOUNTER — Ambulatory Visit
Admission: RE | Admit: 2022-08-15 | Discharge: 2022-08-15 | Disposition: A | Discharge status: Home / Self Care | Payer: No Typology Code available for payment source | Source: Ambulatory Visit | Attending: Family Medicine | Admitting: Family Medicine

## 2022-08-15 DIAGNOSIS — E7800 Pure hypercholesterolemia, unspecified: Secondary | ICD-10-CM

## 2022-10-14 ENCOUNTER — Encounter (HOSPITAL_COMMUNITY): Payer: Self-pay

## 2022-10-14 ENCOUNTER — Other Ambulatory Visit: Payer: Self-pay

## 2022-10-14 ENCOUNTER — Emergency Department (HOSPITAL_COMMUNITY)
Admission: EM | Admit: 2022-10-14 | Discharge: 2022-10-15 | Disposition: A | Discharge status: Home / Self Care | Payer: Medicare Other | Attending: Emergency Medicine | Admitting: Emergency Medicine

## 2022-10-14 ENCOUNTER — Emergency Department (HOSPITAL_COMMUNITY): Payer: Medicare Other

## 2022-10-14 DIAGNOSIS — R079 Chest pain, unspecified: Secondary | ICD-10-CM

## 2022-10-14 DIAGNOSIS — R0789 Other chest pain: Secondary | ICD-10-CM | POA: Insufficient documentation

## 2022-10-14 DIAGNOSIS — Z91040 Latex allergy status: Secondary | ICD-10-CM | POA: Diagnosis not present

## 2022-10-14 LAB — CBC WITH DIFFERENTIAL/PLATELET
Abs Immature Granulocytes: 0.02 10*3/uL (ref 0.00–0.07)
Basophils Absolute: 0.1 10*3/uL (ref 0.0–0.1)
Basophils Relative: 1 %
Eosinophils Absolute: 0.1 10*3/uL (ref 0.0–0.5)
Eosinophils Relative: 1 %
HCT: 43.3 % (ref 36.0–46.0)
Hemoglobin: 14 g/dL (ref 12.0–15.0)
Immature Granulocytes: 0 %
Lymphocytes Relative: 32 %
Lymphs Abs: 2.2 10*3/uL (ref 0.7–4.0)
MCH: 30.8 pg (ref 26.0–34.0)
MCHC: 32.3 g/dL (ref 30.0–36.0)
MCV: 95.2 fL (ref 80.0–100.0)
Monocytes Absolute: 0.3 10*3/uL (ref 0.1–1.0)
Monocytes Relative: 5 %
Neutro Abs: 4.1 10*3/uL (ref 1.7–7.7)
Neutrophils Relative %: 61 %
Platelets: 373 10*3/uL (ref 150–400)
RBC: 4.55 MIL/uL (ref 3.87–5.11)
RDW: 12.4 % (ref 11.5–15.5)
WBC: 6.7 10*3/uL (ref 4.0–10.5)
nRBC: 0 % (ref 0.0–0.2)

## 2022-10-14 LAB — COMPREHENSIVE METABOLIC PANEL
ALT: 15 U/L (ref 0–44)
AST: 20 U/L (ref 15–41)
Albumin: 4.1 g/dL (ref 3.5–5.0)
Alkaline Phosphatase: 60 U/L (ref 38–126)
Anion gap: 9 (ref 5–15)
BUN: 9 mg/dL (ref 8–23)
CO2: 28 mmol/L (ref 22–32)
Calcium: 9.3 mg/dL (ref 8.9–10.3)
Chloride: 102 mmol/L (ref 98–111)
Creatinine, Ser: 0.83 mg/dL (ref 0.44–1.00)
GFR, Estimated: >60 mL/min (ref >60)
Glucose, Bld: 86 mg/dL (ref 70–99)
Potassium: 4 mmol/L (ref 3.5–5.1)
Sodium: 139 mmol/L (ref 135–145)
Total Bilirubin: 1 mg/dL (ref 0.3–1.2)
Total Protein: 6.7 g/dL (ref 6.5–8.1)

## 2022-10-14 LAB — TROPONIN I (HIGH SENSITIVITY)
Troponin I (High Sensitivity): 4 ng/L (ref <18)
Troponin I (High Sensitivity): 4 ng/L (ref <18)

## 2022-10-14 MED ORDER — ACETAMINOPHEN 325 MG PO TABS: 650.0000 mg | ORAL_TABLET | ORAL | Status: DC

## 2022-10-14 MED ORDER — OXYCODONE-ACETAMINOPHEN 5-325 MG PO TABS: 1.0000 | ORAL_TABLET | Freq: Once | ORAL | Status: DC

## 2022-10-14 MED ORDER — IOHEXOL 350 MG/ML SOLN
100.0000 mL | Freq: Once | INTRAVENOUS | Status: AC | PRN
  Administered 2022-10-14: 100 mL via INTRAVENOUS

## 2022-10-14 NOTE — ED Provider Notes (Incomplete)
[facility] Provider Note   CSN: [id number] Arrival date & time: 10/14/22  1716     History {Add pertinent medical, surgical, social history, OB history to HPI:1} Chief Complaint  Patient presents with  . Chest Pain    CAELYN ROUTE is a 67 y.o. female.   Chest Pain      Home Medications Prior to Admission medications   Medication Sig Start Date End Date Taking? Authorizing Provider  albuterol (PROVENTIL HFA;VENTOLIN HFA) 108 (90 BASE) MCG/ACT inhaler Inhale 2 puffs into the lungs every 6 (six) hours as needed. For shortness of breath    [provider]  amoxicillin-clavulanate (AUGMENTIN) 875-125 MG tablet Take 1 tablet by mouth 2 (two) times daily. One po bid x 7 days 12/07/18   Molpus, John, MD  budesonide-formoterol [facility]) 80-4.5 MCG/ACT inhaler Inhale 2 puffs into the lungs as needed (shortness of breath).     [provider]  diclofenac (VOLTAREN) 50 MG EC tablet Take 50 mg by mouth 2 (two) times daily as needed for mild pain.    [provider]  dicyclomine (BENTYL) 20 MG tablet Take 1 tablet (20 mg total) by mouth every 8 (eight) hours as needed for spasms (abdominal pain). 03/15/18   Petrucelli, Samantha R, PA-C  diphenhydrAMINE (BENADRYL) 25 MG tablet Take 25 mg by mouth at bedtime as needed. For allergies    [provider]  HYDROcodone-acetaminophen (NORCO) 5-325 MG tablet Take 1 tablet by mouth every 6 (six) hours as needed for severe pain. 12/07/18   Molpus, Jenny Reichmann, MD  ondansetron (ZOFRAN ODT) 8 MG disintegrating tablet Take 1 tablet (8 mg total) by mouth every 8 (eight) hours as needed for nausea or vomiting. 12/07/18   Molpus, John, MD      Allergies    Aspirin, Ibuprofen, and Latex    Review of Systems   Review of Systems  Cardiovascular:  Positive for chest pain.    Physical Exam Updated Vital Signs BP 119/86   Pulse 94   Temp 98.4 F (36.9 C)   Resp 20   SpO2 100%  Physical  Exam  ED Results / Procedures / Treatments   Labs (all labs ordered are listed, but only abnormal results are displayed) Labs Reviewed  COMPREHENSIVE METABOLIC PANEL  CBC WITH DIFFERENTIAL/PLATELET  TROPONIN I (HIGH SENSITIVITY)  TROPONIN I (HIGH SENSITIVITY)    EKG None  Radiology CT Angio Chest/Abd/Pel for Dissection W and/or W/WO  Result Date: 10/14/2022 CLINICAL DATA:  Acute aortic syndrome suspected.  Chest pain. EXAM: CT ANGIOGRAPHY CHEST, ABDOMEN AND PELVIS TECHNIQUE: Non-contrast CT of the chest was initially obtained. Multidetector CT imaging through the chest, abdomen and pelvis was performed using the standard protocol during bolus administration of intravenous contrast. Multiplanar reconstructed images and MIPs were obtained and reviewed to evaluate the vascular anatomy. RADIATION DOSE REDUCTION: This exam was performed according to the departmental dose-optimization program which includes automated exposure control, adjustment of the mA and/or kV according to patient size and/or use of iterative reconstruction technique. CONTRAST:  137m OMNIPAQUE IOHEXOL 350 MG/ML SOLN COMPARISON:  None Available. FINDINGS: CTA CHEST FINDINGS Cardiovascular: The heart is normal in size and there is no pericardial effusion. The aorta is normal in caliber without evidence of aneurysm or dissection. Pulmonary trunk is within normal limits. Mediastinum/Nodes: No mediastinal, hilar, or axillary lymphadenopathy. The thyroid gland, trachea, and esophagus are within normal limits. Lungs/Pleura: Mild atelectasis along the paramediastinal border and lung base in the right lower  lobe. No effusion or pneumothorax. Musculoskeletal: Cervical spinal fusion hardware is noted. Degenerative changes in the thoracic spine. No acute or suspicious osseous abnormality. Review of the MIP images confirms the above findings. CTA ABDOMEN AND PELVIS FINDINGS VASCULAR Aorta: Normal caliber aorta without aneurysm, dissection,  vasculitis or significant stenosis. Celiac: Patent without evidence of aneurysm, dissection, vasculitis or significant stenosis. SMA: Patent without evidence of aneurysm, dissection, vasculitis or significant stenosis. Renals: Both renal arteries are patent without evidence of aneurysm, dissection, vasculitis, fibromuscular dysplasia or significant stenosis. IMA: Patent without evidence of aneurysm, dissection, vasculitis or significant stenosis. Inflow: Patent without evidence of aneurysm, dissection, vasculitis or significant stenosis. Veins: No obvious venous abnormality within the limitations of this arterial phase study. Review of the MIP images confirms the above findings. NON-VASCULAR Hepatobiliary: No focal liver abnormality is seen. No gallstones, gallbladder wall thickening, or biliary dilatation. Pancreas: Unremarkable. No pancreatic ductal dilatation or surrounding inflammatory changes. Spleen: Normal in size without focal abnormality. Adrenals/Urinary Tract: No adrenal nodule or mass. The kidneys enhance symmetrically. No renal calculus or hydronephrosis. The bladder is unremarkable. Stomach/Bowel: Stomach is within normal limits. Appendix is not seen. No evidence of bowel wall thickening, distention, or inflammatory changes. No free air or pneumatosis. Scattered diverticula are present along the colon without evidence of diverticulitis. Lymphatic: No abdominal or pelvic lymphadenopathy. Reproductive: Uterus and bilateral adnexa are unremarkable. Other: No abdominopelvic ascites.  Fat containing umbilical hernia. Musculoskeletal: Degenerative changes are present in the lumbar spine. No acute osseous abnormality. Review of the MIP images confirms the above findings. IMPRESSION: 1. No evidence of aortic aneurysm or dissection. 2. No acute process in the chest, abdomen, or pelvis. Electronically Signed   By: Brett Fairy M.D.   On: 10/14/2022 23:34   DG Chest 2 View  Result Date: 10/14/2022 CLINICAL  DATA:  Chest pain EXAM: CHEST - 2 VIEW COMPARISON:  Chest x-ray November 15, 2011 FINDINGS: The cardiomediastinal silhouette is unchanged in contour. No focal pulmonary opacity. No pleural effusion or pneumothorax. The visualized upper abdomen is unremarkable. No acute osseous abnormality. IMPRESSION: No acute cardiopulmonary abnormality. Electronically Signed   By: Beryle Flock M.D.   On: 10/14/2022 18:36    Procedures Procedures  {Document cardiac monitor, telemetry assessment procedure when appropriate:1}  Medications Ordered in ED Medications  iohexol (OMNIPAQUE) 350 MG/ML injection 100 mL (100 mLs Intravenous Contrast Given 10/14/22 2313)    ED Course/ Medical Decision Making/ A&P                           Medical Decision Making  ***  {Document critical care time when appropriate:1} {Document review of labs and clinical decision tools ie heart score, Chads2Vasc2 etc:1}  {Document your independent review of radiology images, and any outside records:1} {Document your discussion with family members, caretakers, and with consultants:1} {Document social determinants of health affecting pt's care:1} {Document your decision making why or why not admission, treatments were needed:1} Final Clinical Impression(s) / ED Diagnoses Final diagnoses:  None    Rx / DC Orders ED Discharge Orders     None

## 2022-10-14 NOTE — ED Provider Triage Note (Addendum)
Emergency Medicine Provider Triage Evaluation Note  Lindsay Watts , a 67 y.o. female  was evaluated in triage.  Pt complains of chest pain, back pain.  Patient reports pain beginning in left upper extremity.  Pain then migrated to left-sided chest with radiation to back.  Was seen by primary care and sent to the emergency department due to concern for aortic dissection versus pulmonary embolism versus ACS.  Denies shortness of breath, cough, congestion, abdominal pain, nausea, vomiting.  Denies history of similar symptoms in the past.  Denies recent surgery/immobilization, history of DVT/PE, current anticoagulation, recent travel, no malignancy, current hormonal therapy.  Review of Systems  Positive: See above Negative:   Physical Exam  BP (!) 153/96   Pulse 91   Temp 98.1 F (36.7 C)   Resp 18   SpO2 99%  Gen:   Awake, no distress   Resp:  Normal effort  MSK:   Moves extremities without difficulty  Other:    Medical Decision Making  Medically screening exam initiated at 6:04 PM.  Appropriate orders placed.  [name] was informed that the remainder of the evaluation will be completed by another provider, this initial triage assessment does not replace that evaluation, and the importance of remaining in the ED until their evaluation is complete.  CT imaging of chest not ordered at this time.  Room requested.   [name], [place] 10/14/22 1827    [name], [place] 10/14/22 2049

## 2022-10-14 NOTE — ED Triage Notes (Signed)
Pt came in via POV d/t CP/back pain that started last night & she went to see her PCP & he sent her here to be safe plus d/t her HTN. A/Ox4, no cardiac Hx, does have fibromyalgia.

## 2022-10-15 DIAGNOSIS — R0789 Other chest pain: Secondary | ICD-10-CM | POA: Diagnosis not present

## 2022-10-15 MED ORDER — METHOCARBAMOL 500 MG PO TABS: 500.0000 mg | ORAL_TABLET | Freq: Two times a day (BID) | ORAL | 0 refills | Status: DC

## 2022-10-15 MED ORDER — LIDOCAINE 5 % EX PTCH: 1.0000 | MEDICATED_PATCH | CUTANEOUS | 0 refills | Status: DC

## 2022-10-15 MED ORDER — METHOCARBAMOL 500 MG PO TABS
500.0000 mg | ORAL_TABLET | Freq: Once | ORAL | Status: AC
  Administered 2022-10-15: 500 mg via ORAL
  Filled 2022-10-15: qty 1

## 2022-10-15 MED ORDER — LIDOCAINE 5 % EX PTCH
1.0000 | MEDICATED_PATCH | CUTANEOUS | Status: DC
  Administered 2022-10-15: 1 via TRANSDERMAL
  Filled 2022-10-15: qty 1

## 2022-10-15 MED ORDER — ACETAMINOPHEN 500 MG PO TABS
1000.0000 mg | ORAL_TABLET | ORAL | Status: AC
  Administered 2022-10-15: 1000 mg via ORAL
  Filled 2022-10-15: qty 2

## 2022-10-15 NOTE — ED Provider Notes (Signed)
[facility] EMERGENCY DEPARTMENT Provider Note   CSN: [id number] Arrival date & time: 10/14/22  1716     History  Chief Complaint  Patient presents with   Chest Pain    Lindsay Watts is a 67 y.o. female.   Chest Pain Patient is a 67 year old female with a past medical history significant for fibromyalgia  She is presenting emergency room today with complaints of pain that began yesterday and her left shoulder is achy and constant and eventually spread to her left chest.  She endorses some back pain as well.  She states that the quality and location of use areas of pain are consistent with prior fibromyalgia pain flares that she had had she states she has been in the hospital twice in the past for fibromyalgia.  She states that she does not have any associated shortness of breath nausea vomiting lightheadedness or dizziness.  She went to a primary care office/urgent care in order to obtain some muscle relaxers that she says this has been helpful for her in the past and was sent to the emergency room for further evaluation.  She states that the pain is nonexertional.  She states that it is sometimes a bit worse with deep breaths has not had any hemoptysis no history of VTE no lower extremity swelling that was unilateral or bilateral.  She does not have any worsening in her pain with exertion.  She is not a cancer patient no recent hospitalization or surgeries or long travel.  She states her pain has gotten no better or worse.  She states that she had a similar episode in 2013 and was admitted to had a stress echocardiogram that was normal she also had a CT coronary calcium score study done within the past few months that was "completely clean ".  She does not have a cardiologist, this was done by her primary care provider.  No fevers chills cough congestion abdominal pain or other associated symptoms.  She took 2 Motrin tablets earlier today with minimal relief.     Home  Medications Prior to Admission medications   Medication Sig Start Date End Date Taking? Authorizing Provider  lidocaine (LIDODERM) 5 % Place 1 patch onto the skin daily. Remove & Discard patch within 12 hours or as directed by MD 10/15/22  Yes Haydn Cush, Kathleene Hazel, PA  methocarbamol (ROBAXIN) 500 MG tablet Take 1 tablet (500 mg total) by mouth 2 (two) times daily. 10/15/22  Yes Vu Liebman S, PA  albuterol (PROVENTIL HFA;VENTOLIN HFA) 108 (90 BASE) MCG/ACT inhaler Inhale 2 puffs into the lungs every 6 (six) hours as needed. For shortness of breath    [provider]  amoxicillin-clavulanate (AUGMENTIN) 875-125 MG tablet Take 1 tablet by mouth 2 (two) times daily. One po bid x 7 days 12/07/18   Molpus, John, MD  budesonide-formoterol [facility]) 80-4.5 MCG/ACT inhaler Inhale 2 puffs into the lungs as needed (shortness of breath).     [provider]  diclofenac (VOLTAREN) 50 MG EC tablet Take 50 mg by mouth 2 (two) times daily as needed for mild pain.    [provider]  dicyclomine (BENTYL) 20 MG tablet Take 1 tablet (20 mg total) by mouth every 8 (eight) hours as needed for spasms (abdominal pain). 03/15/18   Petrucelli, Samantha R, PA-C  diphenhydrAMINE (BENADRYL) 25 MG tablet Take 25 mg by mouth at bedtime as needed. For allergies    [provider]  HYDROcodone-acetaminophen (NORCO) 5-325 MG tablet Take  1 tablet by mouth every 6 (six) hours as needed for severe pain. 12/07/18   Molpus, Jenny Reichmann, MD  ondansetron (ZOFRAN ODT) 8 MG disintegrating tablet Take 1 tablet (8 mg total) by mouth every 8 (eight) hours as needed for nausea or vomiting. 12/07/18   Molpus, John, MD      Allergies    Aspirin, Ibuprofen, and Latex    Review of Systems   Review of Systems  Cardiovascular:  Positive for chest pain.    Physical Exam Updated Vital Signs BP 128/84   Pulse 72   Temp 98 F (36.7 C)   Resp 11   SpO2 96%  Physical Exam Vitals and nursing note reviewed.   Constitutional:      General: She is not in acute distress. HENT:     Head: Normocephalic and atraumatic.     Nose: Nose normal.     Mouth/Throat:     Mouth: Mucous membranes are moist.  Eyes:     General: No scleral icterus. Cardiovascular:     Rate and Rhythm: Normal rate and regular rhythm.     Pulses: Normal pulses.     Heart sounds: Normal heart sounds.     Comments: Bilateral radial artery pulses 3+ and symmetric Pulmonary:     Effort: Pulmonary effort is normal. No respiratory distress.     Breath sounds: No wheezing.     Comments: Lungs clear no wheezing, speaking in full sentences  Some left-sided anterior chest wall tenderness Chest:     Chest wall: Tenderness present.  Abdominal:     Palpations: Abdomen is soft.     Tenderness: There is no abdominal tenderness. There is no guarding or rebound.  Musculoskeletal:     Cervical back: Normal range of motion.     Right lower leg: No edema.     Left lower leg: No edema.     Comments: No lower extremity edema or calf tenderness  Skin:    General: Skin is warm and dry.     Capillary Refill: Capillary refill takes less than 2 seconds.  Neurological:     Mental Status: She is alert. Mental status is at baseline.  Psychiatric:        Mood and Affect: Mood normal.        Behavior: Behavior normal.    ED Results / Procedures / Treatments   Labs (all labs ordered are listed, but only abnormal results are displayed) Labs Reviewed  COMPREHENSIVE METABOLIC PANEL  CBC WITH DIFFERENTIAL/PLATELET  TROPONIN I (HIGH SENSITIVITY)  TROPONIN I (HIGH SENSITIVITY)    EKG EKG Interpretation  Date/Time:  Friday October 14 2022 17:39:30 EST Ventricular Rate:  90 PR Interval:  116 QRS Duration: 84 QT Interval:  348 QTC Calculation: 425 R Axis:   65 Text Interpretation: Normal sinus rhythm Normal ECG When compared with ECG of 16-Nov-2011 03:27, PREVIOUS ECG IS PRESENT No significant change was found Confirmed by Gerlene Fee  [phone]) on 10/14/2022 11:54:03 PM  Radiology CT Angio Chest/Abd/Pel for Dissection W and/or W/WO  Result Date: 10/14/2022 CLINICAL DATA:  Acute aortic syndrome suspected.  Chest pain. EXAM: CT ANGIOGRAPHY CHEST, ABDOMEN AND PELVIS TECHNIQUE: Non-contrast CT of the chest was initially obtained. Multidetector CT imaging through the chest, abdomen and pelvis was performed using the standard protocol during bolus administration of intravenous contrast. Multiplanar reconstructed images and MIPs were obtained and reviewed to evaluate the vascular anatomy. RADIATION DOSE REDUCTION: This exam was performed according to the departmental dose-optimization program which  includes automated exposure control, adjustment of the mA and/or kV according to patient size and/or use of iterative reconstruction technique. CONTRAST:  173m OMNIPAQUE IOHEXOL 350 MG/ML SOLN COMPARISON:  None Available. FINDINGS: CTA CHEST FINDINGS Cardiovascular: The heart is normal in size and there is no pericardial effusion. The aorta is normal in caliber without evidence of aneurysm or dissection. Pulmonary trunk is within normal limits. Mediastinum/Nodes: No mediastinal, hilar, or axillary lymphadenopathy. The thyroid gland, trachea, and esophagus are within normal limits. Lungs/Pleura: Mild atelectasis along the paramediastinal border and lung base in the right lower lobe. No effusion or pneumothorax. Musculoskeletal: Cervical spinal fusion hardware is noted. Degenerative changes in the thoracic spine. No acute or suspicious osseous abnormality. Review of the MIP images confirms the above findings. CTA ABDOMEN AND PELVIS FINDINGS VASCULAR Aorta: Normal caliber aorta without aneurysm, dissection, vasculitis or significant stenosis. Celiac: Patent without evidence of aneurysm, dissection, vasculitis or significant stenosis. SMA: Patent without evidence of aneurysm, dissection, vasculitis or significant stenosis. Renals: Both renal arteries are patent  without evidence of aneurysm, dissection, vasculitis, fibromuscular dysplasia or significant stenosis. IMA: Patent without evidence of aneurysm, dissection, vasculitis or significant stenosis. Inflow: Patent without evidence of aneurysm, dissection, vasculitis or significant stenosis. Veins: No obvious venous abnormality within the limitations of this arterial phase study. Review of the MIP images confirms the above findings. NON-VASCULAR Hepatobiliary: No focal liver abnormality is seen. No gallstones, gallbladder wall thickening, or biliary dilatation. Pancreas: Unremarkable. No pancreatic ductal dilatation or surrounding inflammatory changes. Spleen: Normal in size without focal abnormality. Adrenals/Urinary Tract: No adrenal nodule or mass. The kidneys enhance symmetrically. No renal calculus or hydronephrosis. The bladder is unremarkable. Stomach/Bowel: Stomach is within normal limits. Appendix is not seen. No evidence of bowel wall thickening, distention, or inflammatory changes. No free air or pneumatosis. Scattered diverticula are present along the colon without evidence of diverticulitis. Lymphatic: No abdominal or pelvic lymphadenopathy. Reproductive: Uterus and bilateral adnexa are unremarkable. Other: No abdominopelvic ascites.  Fat containing umbilical hernia. Musculoskeletal: Degenerative changes are present in the lumbar spine. No acute osseous abnormality. Review of the MIP images confirms the above findings. IMPRESSION: 1. No evidence of aortic aneurysm or dissection. 2. No acute process in the chest, abdomen, or pelvis. Electronically Signed   By: LBrett FairyM.D.   On: 10/14/2022 23:34   DG Chest 2 View  Result Date: 10/14/2022 CLINICAL DATA:  Chest pain EXAM: CHEST - 2 VIEW COMPARISON:  Chest x-ray November 15, 2011 FINDINGS: The cardiomediastinal silhouette is unchanged in contour. No focal pulmonary opacity. No pleural effusion or pneumothorax. The visualized upper abdomen is unremarkable.  No acute osseous abnormality. IMPRESSION: No acute cardiopulmonary abnormality. Electronically Signed   By: MBeryle FlockM.D.   On: 10/14/2022 18:36    Procedures Procedures    Medications Ordered in ED Medications  lidocaine (LIDODERM) 5 % 1 patch (1 patch Transdermal Patch Applied 10/15/22 0033)  iohexol (OMNIPAQUE) 350 MG/ML injection 100 mL (100 mLs Intravenous Contrast Given 10/14/22 2313)  acetaminophen (TYLENOL) tablet 1,000 mg (1,000 mg Oral Given 10/15/22 0032)  methocarbamol (ROBAXIN) tablet 500 mg (500 mg Oral Given 10/15/22 0032)    ED Course/ Medical Decision Making/ A&P Clinical Course as of 10/15/22 0137  Fri Oct 14, 2022  2354 Sx started last night.  Some discomfort in her left shoulder during the day but got much worse when she laid down.   No NV or SOB.   [WF]  29629HLD - neg stress test  CT  scan coronary ct normal by PCP.  [WF]    Clinical Course User Index [WF] Tedd Sias, [place]                           Medical Decision Making Risk OTC drugs. Prescription drug management.   This patient presents to the ED for concern of chest pain, this involves a number of treatment options, and is a complaint that carries with it a moderate to high risk of complications and morbidity. A differential diagnosis was considered for the patient's symptoms which is discussed below:   The emergent causes of chest pain include: Acute coronary syndrome, tamponade, pericarditis/myocarditis, aortic dissection, pulmonary embolism, tension pneumothorax, pneumonia, and esophageal rupture.    I do not believe the patient has an emergent cause of chest pain, other urgent/non-acute considerations include, but are not limited to: chronic angina, aortic stenosis, cardiomyopathy, mitral valve prolapse, pulmonary hypertension, aortic insufficiency, right ventricular hypertrophy, pleuritis, bronchitis, pneumothorax, tumor, gastroesophageal reflux disease (GERD), esophageal spasm,  Mallory-Weiss syndrome, peptic ulcer disease, pancreatitis, functional gastrointestinal pain, cervical or thoracic disk disease or arthritis, shoulder arthritis, costochondritis, subacromial bursitis, anxiety or panic attack, herpes zoster, breast disorders, chest wall tumors, thoracic outlet syndrome, mediastinitis.    Co morbidities: Discussed in HPI   Brief History:  Patient is a 67 year old female with a past medical history significant for fibromyalgia  She is presenting emergency room today with complaints of pain that began yesterday and her left shoulder is achy and constant and eventually spread to her left chest.  She endorses some back pain as well.  She states that the quality and location of use areas of pain are consistent with prior fibromyalgia pain flares that she had had she states she has been in the hospital twice in the past for fibromyalgia.  She states that she does not have any associated shortness of breath nausea vomiting lightheadedness or dizziness.  She went to a primary care office/urgent care in order to obtain some muscle relaxers that she says this has been helpful for her in the past and was sent to the emergency room for further evaluation.  She states that the pain is nonexertional.  She states that it is sometimes a bit worse with deep breaths has not had any hemoptysis no history of VTE no lower extremity swelling that was unilateral or bilateral.  She does not have any worsening in her pain with exertion.  She is not a cancer patient no recent hospitalization or surgeries or long travel.  She states her pain has gotten no better or worse.  She states that she had a similar episode in 2013 and was admitted to had a stress echocardiogram that was normal she also had a CT coronary calcium score study done within the past few months that was "completely clean ".  She does not have a cardiologist, this was done by her primary care provider.  No fevers chills cough  congestion abdominal pain or other associated symptoms.  She took 2 Motrin tablets earlier today with minimal relief.    EMR reviewed including pt PMHx, past surgical history and past visits to ER.   See HPI for more details   Lab Tests:   I personally reviewed all laboratory work and imaging. Metabolic panel without any acute abnormality specifically kidney function within normal limits and no significant electrolyte abnormalities. CBC without leukocytosis or significant anemia. Troponin x 2 within normal limits  Imaging Studies:  NAD. I personally reviewed all imaging studies and no acute abnormality found. I agree with radiology interpretation.  IMPRESSION:  1. No evidence of aortic aneurysm or dissection.  2. No acute process in the chest, abdomen, or pelvis.      Electronically Signed    By: Brett Fairy M.D.    On: 10/14/2022 23:34   CXR - NML  Cardiac Monitoring:  The patient was maintained on a cardiac monitor.  I personally viewed and interpreted the cardiac monitored which showed an underlying rhythm of: NSR EKG non-ischemic   Medicines ordered:  I ordered medication including tylenol, robaxin, lidoderm for pain Reevaluation of the patient after these medicines showed that the patient improved I have reviewed the patients home medicines and have made adjustments as needed   Critical Interventions:     Consults/Attending Physician   I discussed this case with my attending physician who cosigned this note including patient's presenting symptoms, physical exam, and planned diagnostics and interventions. Attending physician stated agreement with plan or made changes to plan which were implemented.   Reevaluation:  After the interventions noted above I re-evaluated patient and found that they have :improved   Social Determinants of Health:      Problem List / ED Course:  67 year old female with no centigrams cardiac risk factors apart from some  borderline HLD, has had normal CT coronary calcium score imaging done recently per patient and has had previous episodes of chest pain with normal workup on admission ultimately thought to be fibromyalgia.  Patient states that her symptoms feel similar to fibromyalgia flare she has had in the past.  She has had a normal dissection study and I have low suspicion for PE which was also not seen on CT imaging.  EKG nonischemic troponins x 2 within normal limits.  I had a lengthy shared decision-making oversedation with patient and she would prefer not to be admitted and would prefer to be discharged home.  We discussed pain control and settled on Robaxin and Lidoderm patches.  She will follow-up closely PCP and I counseled her to return immediately to the emergency room for new or concerning symptoms such as associated nausea shortness of breath vomiting fatigue lightheadedness dizziness or any new or concerning symptoms.   Dispostion:  After consideration of the diagnostic results and the patients response to treatment, I feel that the patent would benefit from outpatient follow-up and strict return precautions to the emergency room   Final Clinical Impression(s) / ED Diagnoses Final diagnoses:  Chest pain, unspecified type    Rx / DC Orders ED Discharge Orders          Ordered    methocarbamol (ROBAXIN) 500 MG tablet  2 times daily        10/15/22 0116    lidocaine (LIDODERM) 5 %  Every 24 hours        10/15/22 0116              Tedd Sias, PA 10/15/22 0409    Maudie Flakes, MD 10/15/22 [phone]

## 2022-10-15 NOTE — Discharge Instructions (Signed)
As we discussed your workup today was reassuringly without any abnormal findings.  You should however continue to monitor your symptoms and if you develop any new symptoms such as nausea vomiting dyspnea/difficulty breathing or any other new or concerning symptoms please return to the ER for reevaluation.  Ultimately however I would like you to follow-up with your primary care doctor.  Alternate Tylenol and Motrin as discussed below Lidoderm patches and muscle relaxer also prescribed.  Please use Tylenol or ibuprofen for pain.  You may use 600 mg ibuprofen every 6 hours or 1000 mg of Tylenol every 6 hours.  You may choose to alternate between the 2.  This would be most effective.  Not to exceed 4 g of Tylenol within 24 hours.  Not to exceed 3200 mg ibuprofen 24 hours.

## 2022-12-14 ENCOUNTER — Ambulatory Visit
Admission: RE | Admit: 2022-12-14 | Discharge: 2022-12-14 | Disposition: A | Discharge status: Home / Self Care | Payer: Medicare Other | Source: Ambulatory Visit | Attending: Family Medicine | Admitting: Family Medicine

## 2022-12-14 DIAGNOSIS — Z13820 Encounter for screening for osteoporosis: Secondary | ICD-10-CM

## 2023-02-13 ENCOUNTER — Ambulatory Visit (INDEPENDENT_AMBULATORY_CARE_PROVIDER_SITE_OTHER): Payer: Medicare Other | Admitting: Sports Medicine

## 2023-02-13 ENCOUNTER — Ambulatory Visit (INDEPENDENT_AMBULATORY_CARE_PROVIDER_SITE_OTHER): Payer: Medicare Other | Admitting: Orthopaedic Surgery

## 2023-02-13 ENCOUNTER — Other Ambulatory Visit: Payer: Self-pay

## 2023-02-13 ENCOUNTER — Ambulatory Visit (INDEPENDENT_AMBULATORY_CARE_PROVIDER_SITE_OTHER): Payer: Medicare Other

## 2023-02-13 DIAGNOSIS — M25551 Pain in right hip: Secondary | ICD-10-CM

## 2023-02-13 DIAGNOSIS — M1611 Unilateral primary osteoarthritis, right hip: Secondary | ICD-10-CM

## 2023-02-13 MED ORDER — METHYLPREDNISOLONE ACETATE 40 MG/ML IJ SUSP
80.0000 mg | INTRAMUSCULAR | Status: AC | PRN
  Administered 2023-02-13: 80 mg via INTRA_ARTICULAR

## 2023-02-13 MED ORDER — LIDOCAINE HCL 1 % IJ SOLN
4.0000 mL | INTRAMUSCULAR | Status: AC | PRN
  Administered 2023-02-13: 4 mL

## 2023-02-13 NOTE — Progress Notes (Addendum)
   Procedure Note  Patient: Lindsay Watts             Date of Birth: 1956-06-08           MRN: [id number]             Visit Date: 02/13/2023  Procedures: Visit Diagnoses:  1. Pain in right hip   2. Unilateral primary osteoarthritis, right hip    Large Joint Inj: R hip joint on 02/13/2023 2:43 PM Indications: pain Details: 22 G 3.5 in needle, ultrasound-guided anterior approach Medications: 4 mL lidocaine 1 %; 80 mg methylPREDNISolone acetate 40 MG/ML Outcome: tolerated well, no immediate complications  Procedure: US-guided intra-articular hip injection, Right After discussion on risks/benefits/indications and informed verbal consent was obtained, a timeout was performed. Patient was lying supine on exam table. The hip was cleaned with betadine and alcohol swabs. Then utilizing ultrasound guidance, the patient's femoral head and neck junction was identified and subsequently injected with 4:2 lidocaine:depomedrol via an in-plane approach with ultrasound visualization of the injectate administered into the hip joint. Patient tolerated procedure well without immediate complications.   Procedure, treatment alternatives, risks and benefits explained, specific risks discussed. Consent was given by the patient. Immediately prior to procedure a time out was called to verify the correct patient, procedure, equipment, support staff and site/side marked as required. Patient was prepped and draped in the usual sterile fashion.    - I evaluated the patient about 10 minutes post-injection and she had improvement in pain and range of motion.  She did find back for a few minutes after the injection to prevent dizziness.  She had 2 prior syncopal episodes with injections in the past, otherwise she was doing well today - follow-up with Dr. Magnus Ivan as indicated; I am happy to see them as needed  Madelyn Brunner, DO Primary Care Sports Medicine Physician  [facility] - Orthoped[facility]  This note was  dictated using Dragon naturally speaking software and may contain errors in syntax, spelling, or content which have not been identified prior to signing this note.

## 2023-02-13 NOTE — Progress Notes (Signed)
The patient is a very pleasant and active 67 year old female who comes to me today to discuss hip replacement surgery through an anterior approach for her right hip.  Her son-in-law was asked the physical therapist and she was referred to [place].  She originally saw one of my colleagues in town who is a really good Investment banker, operational but performs posterior hip surgery.  She states that she would rather have this to the front after talking to her son-in-law.  Also the fact that she really loves riding on her JetSki and is very active in the water.  She would like to be able to still do those types of activities.  She is not diabetic.  She is not overweight.  She is very active and does not walk with assistive device either.  I was able to review all of notes within epic.  She was originally scheduled for a posterior hip replacement earlier this year but canceled that.  She does hurt in the groin on the right side.  This has been getting progressively worse for her and at this point her right hip pain and catching in that hip is causing her to almost fall.  At this point her pain is 10 out of 10 and is detrimentally affecting her mobility, her quality of life and her actives of daily living.  On exam her left hip moves smoothly and fluidly with no issues at all.  Her right hip has significant pain with any internal and external rotation as well as severe pain in the groin.  An AP pelvis lateral right hip shows severe arthritis of the right hip especially at the superior lateral aspect where there is sclerotic changes and significant joint space narrowing.  There is also an osteophyte around the hip.  We had a long thorough discussion about anterior hip replacement surgery.  I described what the surgery involves.  I talked about the intraoperative and postoperative course and the risk and benefits of surgery.  She is someone who gets significantly nauseated with any type of surgical intervention in understands that  we will try this under spinal anesthesia and that she can discuss nausea control that day with anesthesia as well because they are really receptive to this.  I did give her handout about hip replacement surgery.  We are talking about having this done sometime later in July so I do feel is reasonable to send her to my partner Dr. Shon Baton for an ultrasound-guided intra-articular injection of a steroid in her right hip joint.  She agrees with this as well.  We will be in touch about scheduling surgery for her having a right total hip replacement.

## 2023-02-16 ENCOUNTER — Telehealth: Payer: Self-pay | Admitting: Sports Medicine

## 2023-02-16 NOTE — Telephone Encounter (Signed)
Patient advising that the injection that was given is not helping and she is in severe pain. Sending call to Triage

## 2023-02-16 NOTE — Telephone Encounter (Signed)
Pt states the injection that was given on Mon has caused worse pain than before she came in- tried heat, ibuprofen and tylenol and nothing seems to work. States she has not done the ice yet.- leg keeps locking up on her and seems like she can not move. Wants to know what else to do?

## 2023-02-17 NOTE — Telephone Encounter (Signed)
LVM

## 2023-04-21 NOTE — Progress Notes (Addendum)
COVID Vaccine Completed:Yes  Date of COVID positive in last 90 days:  No  PCP - Irven Coe, MD Cardiologist - N/A  Chest x-ray - 10-14-22 Epic EKG - 10-17-22 Epic Stress Test/Echo 11-16-11 Epic  Cardiac Cath - N/A Pacemaker/ICD device last checked: Spinal Cord Stimulator:  N/A Cardiac CT - 08-15-22 Epic  Bowel Prep - N/A  Sleep Study - N/A CPAP -   Fasting Blood Sugar - N/A Checks Blood Sugar _____ times a day  Last dose of GLP1 agonist-  N/A GLP1 instructions:  N/A   Last dose of SGLT-2 inhibitors-  N/A SGLT-2 instructions: N/A  Blood Thinner Instructions:  N/A Aspirin Instructions: Last Dose:  Activity level:  Can go up a flight of stairs and perform activities of daily living without stopping and without symptoms of chest pain or shortness of breath.   Anesthesia review:  Evaluation for chest pain 10/2022.  Patient denies any recent episodes of chest pain.    Patient denies shortness of breath, fever, cough and chest pain at PAT appointment  Patient verbalized understanding of instructions that were given to them at the PAT appointment. Patient was also instructed that they will need to review over the PAT instructions again at home before surgery.

## 2023-04-21 NOTE — Patient Instructions (Addendum)
SURGICAL WAITING ROOM VISITATION Patients having surgery or a procedure may have no more than 2 support people in the waiting area - these visitors may rotate.    Children under the age of 24 must have an adult with them who is not the patient.  If the patient needs to stay at the hospital during part of their recovery, the visitor guidelines for inpatient rooms apply. Pre-op nurse will coordinate an appropriate time for 1 support person to accompany patient in pre-op.  This support person may not rotate.    Please refer to the [facility] website for the visitor guidelines for Inpatients (after your surgery is over and you are in a regular room).       Your procedure is scheduled on: 04-28-23   Report to [facility] Main Entrance    Report to admitting at 7:30 AM   Call this number if you have problems the morning of surgery [phone]   Do not eat food :After Midnight.   After Midnight you may have the following liquids until 7:00 AM DAY OF SURGERY  Water Non-Citrus Juices (without pulp, NO RED-Apple, White grape, White cranberry) Black Coffee (NO MILK/CREAM OR CREAMERS, sugar ok)  Clear Tea (NO MILK/CREAM OR CREAMERS, sugar ok) regular and decaf                             Plain Jell-O (NO RED)                                           Fruit ices (not with fruit pulp, NO RED)                                     Popsicles (NO RED)                                                               Sports drinks like Gatorade (NO RED)                   The day of surgery:  Drink ONE (1) Pre-Surgery Clear Ensure at 7:00 AM the morning of surgery. Drink in one sitting. Do not sip.  This drink was given to you during your hospital  pre-op appointment visit. Nothing else to drink after completing the Pre-Surgery Clear Ensure.          If you have questions, please contact your surgeon's office.   FOLLOW  ANY ADDITIONAL PRE OP INSTRUCTIONS YOU RECEIVED FROM YOUR SURGEON'S  OFFICE!!!     Oral Hygiene is also important to reduce your risk of infection.                                    Remember - BRUSH YOUR TEETH THE MORNING OF SURGERY WITH YOUR REGULAR TOOTHPASTE   Do NOT smoke after Midnight   Take these medicines the morning of surgery with A SIP OF WATER:   Zyrtec  Tylenol if needed  Okay to use inhales and nasal spray                              You may not have any metal on your body including hair pins, jewelry, and body piercing             Do not wear make-up, lotions, powders, perfumes, or deodorant  Do not wear nail polish including gel and S&S, artificial/acrylic nails, or any other type of covering on natural nails including finger and toenails. If you have artificial nails, gel coating, etc. that needs to be removed by a nail salon please have this removed prior to surgery or surgery may need to be canceled/ delayed if the surgeon/ anesthesia feels like they are unable to be safely monitored.   Do not shave  48 hours prior to surgery.     Do not bring valuables to the hospital. [facility] IS NOT RESPONSIBLE   FOR VALUABLES.   Contacts, dentures or bridgework may not be worn into surgery.   Bring small overnight bag day of surgery.   DO NOT BRING YOUR HOME MEDICATIONS TO THE HOSPITAL. PHARMACY WILL DISPENSE MEDICATIONS LISTED ON YOUR MEDICATION LIST TO YOU DURING YOUR ADMISSION IN THE HOSPITAL!   Special Instructions: Bring a copy of your healthcare power of attorney and living will documents the day of surgery if you haven't scanned them before.              Please read over the following fact sheets you were given: IF YOU HAVE QUESTIONS ABOUT YOUR PRE-OP INSTRUCTIONS PLEASE CALL [phone] [facility]  If you received a COVID test during your pre-op visit  it is requested that you wear a mask when out in public, stay away from anyone that may not be feeling well and notify your surgeon if you develop symptoms. If you test positive for Covid  or have been in contact with anyone that has tested positive in the last 10 days please notify you surgeon.    Pre-operative 5 CHG Bath Instructions   You can play a key role in reducing the risk of infection after surgery. Your skin needs to be as free of germs as possible. You can reduce the number of germs on your skin by washing with CHG (chlorhexidine gluconate) soap before surgery. CHG is an antiseptic soap that kills germs and continues to kill germs even after washing.   DO NOT use if you have an allergy to chlorhexidine/CHG or antibacterial soaps. If your skin becomes reddened or irritated, stop using the CHG and notify one of our RNs at  [phone] .   Please shower with the CHG soap starting 4 days before surgery using the following schedule:     Please keep in mind the following:  DO NOT shave, including legs and underarms, starting the day of your first shower.   You may shave your face at any point before/day of surgery.  Place clean sheets on your bed the day you start using CHG soap. Use a clean washcloth (not used since being washed) for each shower. DO NOT sleep with pets once you start using the CHG.   CHG Shower Instructions:  If you choose to wash your hair and private area, wash first with your normal shampoo/soap.  After you use shampoo/soap, rinse your hair and body thoroughly to remove shampoo/soap residue.  Turn the water OFF and apply about 3 tablespoons (45  ml) of CHG soap to a CLEAN washcloth.  Apply CHG soap ONLY FROM YOUR NECK DOWN TO YOUR TOES (washing for 3-5 minutes)  DO NOT use CHG soap on face, private areas, open wounds, or sores.  Pay special attention to the area where your surgery is being performed.  If you are having back surgery, having someone wash your back for you may be helpful. Wait 2 minutes after CHG soap is applied, then you may rinse off the CHG soap.  Pat dry with a clean towel  Put on clean clothes/pajamas   If you choose to wear  lotion, please use ONLY the CHG-compatible lotions on the back of this paper.     Additional instructions for the day of surgery: DO NOT APPLY any lotions, deodorants, cologne, or perfumes.   Put on clean/comfortable clothes.  Brush your teeth.  Ask your nurse before applying any prescription medications to the skin.      CHG Compatible Lotions   Aveeno Moisturizing lotion  Cetaphil Moisturizing Cream  Cetaphil Moisturizing Lotion  Clairol Herbal Essence Moisturizing Lotion, Dry Skin  Clairol Herbal Essence Moisturizing Lotion, Extra Dry Skin  Clairol Herbal Essence Moisturizing Lotion, Normal Skin  Curel Age Defying Therapeutic Moisturizing Lotion with Alpha Hydroxy  Curel Extreme Care Body Lotion  Curel Soothing Hands Moisturizing Hand Lotion  Curel Therapeutic Moisturizing Cream, Fragrance-Free  Curel Therapeutic Moisturizing Lotion, Fragrance-Free  Curel Therapeutic Moisturizing Lotion, Original Formula  Eucerin Daily Replenishing Lotion  Eucerin Dry Skin Therapy Plus Alpha Hydroxy Crme  Eucerin Dry Skin Therapy Plus Alpha Hydroxy Lotion  Eucerin Original Crme  Eucerin Original Lotion  Eucerin Plus Crme Eucerin Plus Lotion  Eucerin TriLipid Replenishing Lotion  Keri Anti-Bacterial Hand Lotion  Keri Deep Conditioning Original Lotion Dry Skin Formula Softly Scented  Keri Deep Conditioning Original Lotion, Fragrance Free Sensitive Skin Formula  Keri Lotion Fast Absorbing Fragrance Free Sensitive Skin Formula  Keri Lotion Fast Absorbing Softly Scented Dry Skin Formula  Keri Original Lotion  Keri Skin Renewal Lotion Keri Silky Smooth Lotion  Keri Silky Smooth Sensitive Skin Lotion  Nivea Body Creamy Conditioning Oil  Nivea Body Extra Enriched Lotion  Nivea Body Original Lotion  Nivea Body Sheer Moisturizing Lotion Nivea Crme  Nivea Skin Firming Lotion  NutraDerm 30 Skin Lotion  NutraDerm Skin Lotion  NutraDerm Therapeutic Skin Cream  NutraDerm Therapeutic Skin  Lotion  ProShield Protective Hand Cream  Provon moisturizing lotion   PATIENT SIGNATURE_________________________________  NURSE SIGNATURE__________________________________  ________________________________________________________________________    Rogelia Mire  An incentive spirometer is a tool that can help keep your lungs clear and active. This tool measures how well you are filling your lungs with each breath. Taking long deep breaths may help reverse or decrease the chance of developing breathing (pulmonary) problems (especially infection) following: A long period of time when you are unable to move or be active. BEFORE THE PROCEDURE  If the spirometer includes an indicator to show your best effort, your nurse or respiratory therapist will set it to a desired goal. If possible, sit up straight or lean slightly forward. Try not to slouch. Hold the incentive spirometer in an upright position. INSTRUCTIONS FOR USE  Sit on the edge of your bed if possible, or sit up as far as you can in bed or on a chair. Hold the incentive spirometer in an upright position. Breathe out normally. Place the mouthpiece in your mouth and seal your lips tightly around it. Breathe in slowly and as deeply as possible, raising  the piston or the ball toward the top of the column. Hold your breath for 3-5 seconds or for as long as possible. Allow the piston or ball to fall to the bottom of the column. Remove the mouthpiece from your mouth and breathe out normally. Rest for a few seconds and repeat Steps 1 through 7 at least 10 times every 1-2 hours when you are awake. Take your time and take a few normal breaths between deep breaths. The spirometer may include an indicator to show your best effort. Use the indicator as a goal to work toward during each repetition. After each set of 10 deep breaths, practice coughing to be sure your lungs are clear. If you have an incision (the cut made at the time of  surgery), support your incision when coughing by placing a pillow or rolled up towels firmly against it. Once you are able to get out of bed, walk around indoors and cough well. You may stop using the incentive spirometer when instructed by your caregiver.  RISKS AND COMPLICATIONS Take your time so you do not get dizzy or light-headed. If you are in pain, you may need to take or ask for pain medication before doing incentive spirometry. It is harder to take a deep breath if you are having pain. AFTER USE Rest and breathe slowly and easily. It can be helpful to keep track of a log of your progress. Your caregiver can provide you with a simple table to help with this. If you are using the spirometer at home, follow these instructions: SEEK MEDICAL CARE IF:  You are having difficultly using the spirometer. You have trouble using the spirometer as often as instructed. Your pain medication is not giving enough relief while using the spirometer. You develop fever of 100.5 F (38.1 C) or higher. SEEK IMMEDIATE MEDICAL CARE IF:  You cough up bloody sputum that had not been present before. You develop fever of 102 F (38.9 C) or greater. You develop worsening pain at or near the incision site. MAKE SURE YOU:  Understand these instructions. Will watch your condition. Will get help right away if you are not doing well or get worse. Document Released: 02/06/2007 Document Revised: 12/19/2011 Document Reviewed: 04/09/2007 Exit[employer] Patient Information 2014 Exit[employer], [place].   ________________________________________________________________________ WHAT IS A BLOOD TRANSFUSION? Blood Transfusion Information  A transfusion is the replacement of blood or some of its parts. Blood is made up of multiple cells which provide different functions. Red blood cells carry oxygen and are used for blood loss replacement. White blood cells fight against infection. Platelets control bleeding. Plasma helps clot  blood. Other blood products are available for specialized needs, such as hemophilia or other clotting disorders. BEFORE THE TRANSFUSION  Who gives blood for transfusions?  Healthy volunteers who are fully evaluated to make sure their blood is safe. This is blood bank blood. Transfusion therapy is the safest it has ever been in the practice of medicine. Before blood is taken from a donor, a complete history is taken to make sure that person has no history of diseases nor engages in risky social behavior (examples are intravenous drug use or sexual activity with multiple partners). The donor's travel history is screened to minimize risk of transmitting infections, such as malaria. The donated blood is tested for signs of infectious diseases, such as HIV and hepatitis. The blood is then tested to be sure it is compatible with you in order to minimize the chance of a transfusion reaction. If  you or a relative donates blood, this is often done in anticipation of surgery and is not appropriate for emergency situations. It takes many days to process the donated blood. RISKS AND COMPLICATIONS Although transfusion therapy is very safe and saves many lives, the main dangers of transfusion include:  Getting an infectious disease. Developing a transfusion reaction. This is an allergic reaction to something in the blood you were given. Every precaution is taken to prevent this. The decision to have a blood transfusion has been considered carefully by your caregiver before blood is given. Blood is not given unless the benefits outweigh the risks. AFTER THE TRANSFUSION Right after receiving a blood transfusion, you will usually feel much better and more energetic. This is especially true if your red blood cells have gotten low (anemic). The transfusion raises the level of the red blood cells which carry oxygen, and this usually causes an energy increase. The nurse administering the transfusion will monitor you  carefully for complications. HOME CARE INSTRUCTIONS  No special instructions are needed after a transfusion. You may find your energy is better. Speak with your caregiver about any limitations on activity for underlying diseases you may have. SEEK MEDICAL CARE IF:  Your condition is not improving after your transfusion. You develop redness or irritation at the intravenous (IV) site. SEEK IMMEDIATE MEDICAL CARE IF:  Any of the following symptoms occur over the next 12 hours: Shaking chills. You have a temperature by mouth above 102 F (38.9 C), not controlled by medicine. Chest, back, or muscle pain. People around you feel you are not acting correctly or are confused. Shortness of breath or difficulty breathing. Dizziness and fainting. You get a rash or develop hives. You have a decrease in urine output. Your urine turns a dark color or changes to pink, red, or brown. Any of the following symptoms occur over the next 10 days: You have a temperature by mouth above 102 F (38.9 C), not controlled by medicine. Shortness of breath. Weakness after normal activity. The white part of the eye turns yellow (jaundice). You have a decrease in the amount of urine or are urinating less often. Your urine turns a dark color or changes to pink, red, or brown. Document Released: 09/23/2000 Document Revised: 12/19/2011 Document Reviewed: 05/12/2008 [facility] Patient Information 2014 [place], [place].  _______________________________________________________________________

## 2023-04-24 ENCOUNTER — Encounter (HOSPITAL_COMMUNITY)
Admission: RE | Admit: 2023-04-24 | Discharge: 2023-04-24 | Disposition: A | Discharge status: Home / Self Care | Payer: Medicare Other | Source: Ambulatory Visit | Attending: Orthopaedic Surgery | Admitting: Orthopaedic Surgery

## 2023-04-24 ENCOUNTER — Other Ambulatory Visit: Payer: Self-pay

## 2023-04-24 ENCOUNTER — Encounter (HOSPITAL_COMMUNITY): Payer: Self-pay

## 2023-04-24 VITALS — BP 138/96 | HR 84 | Temp 98.4°F | Resp 16 | Ht 62.0 in | Wt 167.6 lb

## 2023-04-24 DIAGNOSIS — Z01812 Encounter for preprocedural laboratory examination: Secondary | ICD-10-CM | POA: Diagnosis present

## 2023-04-24 DIAGNOSIS — Z01818 Encounter for other preprocedural examination: Secondary | ICD-10-CM

## 2023-04-24 DIAGNOSIS — M1611 Unilateral primary osteoarthritis, right hip: Secondary | ICD-10-CM | POA: Diagnosis not present

## 2023-04-24 HISTORY — DX: Other specified postprocedural states: R11.2

## 2023-04-24 HISTORY — DX: Other specified postprocedural states: Z98.890

## 2023-04-24 HISTORY — DX: Headache, unspecified: R51.9

## 2023-04-24 HISTORY — DX: Gastro-esophageal reflux disease without esophagitis: K21.9

## 2023-04-24 LAB — CBC
HCT: 45.3 % (ref 36.0–46.0)
Hemoglobin: 14.6 g/dL (ref 12.0–15.0)
MCH: 30.6 pg (ref 26.0–34.0)
MCHC: 32.2 g/dL (ref 30.0–36.0)
MCV: 95 fL (ref 80.0–100.0)
Platelets: 318 10*3/uL (ref 150–400)
RBC: 4.77 MIL/uL (ref 3.87–5.11)
RDW: 12.2 % (ref 11.5–15.5)
WBC: 6.8 10*3/uL (ref 4.0–10.5)
nRBC: 0 % (ref 0.0–0.2)

## 2023-04-24 LAB — COMPREHENSIVE METABOLIC PANEL
ALT: 18 U/L (ref 0–44)
AST: 19 U/L (ref 15–41)
Albumin: 4.3 g/dL (ref 3.5–5.0)
Alkaline Phosphatase: 87 U/L (ref 38–126)
Anion gap: 8 (ref 5–15)
BUN: 18 mg/dL (ref 8–23)
CO2: 26 mmol/L (ref 22–32)
Calcium: 9.3 mg/dL (ref 8.9–10.3)
Chloride: 106 mmol/L (ref 98–111)
Creatinine, Ser: 0.59 mg/dL (ref 0.44–1.00)
GFR, Estimated: >60 mL/min (ref >60)
Glucose, Bld: 89 mg/dL (ref 70–99)
Potassium: 4.3 mmol/L (ref 3.5–5.1)
Sodium: 140 mmol/L (ref 135–145)
Total Bilirubin: 1 mg/dL (ref 0.3–1.2)
Total Protein: 7.2 g/dL (ref 6.5–8.1)

## 2023-04-24 LAB — SURGICAL PCR SCREEN
MRSA, PCR: NEGATIVE
Staphylococcus aureus: NEGATIVE

## 2023-04-25 ENCOUNTER — Encounter (HOSPITAL_COMMUNITY): Payer: Self-pay | Admitting: Physician Assistant

## 2023-04-27 ENCOUNTER — Telehealth: Payer: Self-pay | Admitting: *Deleted

## 2023-04-27 NOTE — Telephone Encounter (Signed)
Ortho bundle pre-op call completed. 

## 2023-04-27 NOTE — Care Plan (Signed)
[facility] RNCM call to patient to discuss her upcoming Right total hip arthroplasty with Dr. Magnus Ivan on 04/28/23. She is an Ortho bundle patient through [facility] and is agreeable to case management. She lives with her spouse, who will be assisting after surgery. She has a RW that is a regular sized walker she has borrowed from a friend. She is 5'2" and I asked her to bring to hospital for sizing to make sure this is appropriate height for her or otherwise she would need one before leaving hospital. Anticipate HHPT will be needed after short hospital stay. Referral made to [facility] after choice provided. Reviewed all post op care instructions. Will continue to follow for needs.

## 2023-04-27 NOTE — H&P (Signed)
TOTAL HIP ADMISSION H&P  Patient is admitted for right total hip arthroplasty.  Subjective:  Chief Complaint: right hip pain  HPI: Lindsay Watts, 67 y.o. female, has a history of pain and functional disability in the right hip(s) due to arthritis and patient has failed non-surgical conservative treatments for greater than 12 weeks to include NSAID's and/or analgesics, corticosteriod injections, flexibility and strengthening excercises, weight reduction as appropriate, and activity modification.  Onset of symptoms was gradual starting 2 years ago with gradually worsening course since that time.The patient noted no past surgery on the right hip(s).  Patient currently rates pain in the right hip at 10 out of 10 with activity. Patient has night pain, decreased hip motion, pain with activities and a detrimental effect on her quality of life and mobility due to the arthritis.  Patient has evidence of subchondral sclerosis, periarticular osteophytes, and joint space narrowing by imaging studies. This condition presents safety issues increasing the risk of falls. There is no current active infection.  Patient Active Problem List   Diagnosis Date Noted   Unilateral primary osteoarthritis, right hip 02/13/2023   Past Medical History:  Diagnosis Date   Arthritis    Asthma    Fibromyalgia    GERD (gastroesophageal reflux disease)    Headache    PONV (postoperative nausea and vomiting)     Past Surgical History:  Procedure Laterality Date   ANKLE SURGERY     APPENDECTOMY     CERVICAL SPINE SURGERY     CESAREAN SECTION     Cyst removal arm     CYST REMOVAL HAND     DILATION AND CURETTAGE OF UTERUS     EYE SURGERY     MENISCUS REPAIR Bilateral    OVARY SURGERY      No current facility-administered medications for this encounter.   Current Outpatient Medications  Medication Sig Dispense Refill Last Dose   acetaminophen (TYLENOL) 500 MG tablet Take 1,000 mg by mouth every 6 (six) hours as  needed for moderate pain or mild pain.      albuterol (PROVENTIL HFA;VENTOLIN HFA) 108 (90 BASE) MCG/ACT inhaler Inhale 2 puffs into the lungs every 6 (six) hours as needed for wheezing or shortness of breath. For shortness of breath      cetirizine (ZYRTEC) 10 MG tablet Take 10 mg by mouth daily as needed for allergies.      cholecalciferol (VITAMIN D3) 25 MCG (1000 UNIT) tablet Take 1,000 Units by mouth daily.      fluticasone (FLONASE) 50 MCG/ACT nasal spray Place 1 spray into both nostrils daily as needed for allergies or rhinitis.      ibuprofen (ADVIL) 200 MG tablet Take 200-800 mg by mouth every 6 (six) hours as needed for mild pain or moderate pain.      Multiple Vitamins-Minerals (MULTIVITAMIN WITH MINERALS) tablet Take 1 tablet by mouth daily.      vitamin B-12 (CYANOCOBALAMIN) 100 MCG tablet Take 100 mcg by mouth daily.      lidocaine (LIDODERM) 5 % Place 1 patch onto the skin daily. Remove & Discard patch within 12 hours or as directed by MD (Patient not taking: Reported on 04/20/2023) 30 patch 0 Completed Course   methocarbamol (ROBAXIN) 500 MG tablet Take 1 tablet (500 mg total) by mouth 2 (two) times daily. (Patient not taking: Reported on 04/20/2023) 20 tablet 0 Completed Course   Allergies  Allergen Reactions   Aspirin Shortness Of Breath   Ibuprofen Shortness Of Breath  Latex Itching    Social History   Tobacco Use   Smoking status: Never   Smokeless tobacco: Never  Substance Use Topics   Alcohol use: No    Family History  Problem Relation Age of Onset   Breast cancer Mother        diagnosed in her 58's     Review of Systems  Objective:  Physical Exam Vitals reviewed.  Constitutional:      Appearance: Normal appearance. She is normal weight.  HENT:     Head: Normocephalic and atraumatic.  Eyes:     Extraocular Movements: Extraocular movements intact.     Pupils: Pupils are equal, round, and reactive to light.  Cardiovascular:     Rate and Rhythm: Normal  rate and regular rhythm.  Pulmonary:     Effort: Pulmonary effort is normal.     Breath sounds: Normal breath sounds.  Abdominal:     Palpations: Abdomen is soft.  Musculoskeletal:     Cervical back: Normal range of motion and neck supple.     Right hip: Tenderness and bony tenderness present. Decreased range of motion. Decreased strength.  Neurological:     Mental Status: She is alert and oriented to person, place, and time.  Psychiatric:        Behavior: Behavior normal.     Vital signs in last 24 hours:    Labs:   Estimated body mass index is 30.65 kg/m as calculated from the following:   Height as of 04/24/23: 5\' 2"  (1.575 m).   Weight as of 04/24/23: 76 kg.   Imaging Review Plain radiographs demonstrate severe degenerative joint disease of the right hip(s). The bone quality appears to be good for age and reported activity level.      Assessment/Plan:  End stage arthritis, right hip(s)  The patient history, physical examination, clinical judgement of the provider and imaging studies are consistent with end stage degenerative joint disease of the right hip(s) and total hip arthroplasty is deemed medically necessary. The treatment options including medical management, injection therapy, arthroscopy and arthroplasty were discussed at length. The risks and benefits of total hip arthroplasty were presented and reviewed. The risks due to aseptic loosening, infection, stiffness, dislocation/subluxation,  thromboembolic complications and other imponderables were discussed.  The patient acknowledged the explanation, agreed to proceed with the plan and consent was signed. Patient is being admitted for inpatient treatment for surgery, pain control, PT, OT, prophylactic antibiotics, VTE prophylaxis, progressive ambulation and ADL's and discharge planning.The patient is planning to be discharged home with home health services

## 2023-04-28 DIAGNOSIS — M1611 Unilateral primary osteoarthritis, right hip: Secondary | ICD-10-CM

## 2023-04-28 LAB — TYPE AND SCREEN
ABO/RH(D): A POS
Antibody Screen: NEGATIVE

## 2023-05-01 ENCOUNTER — Telehealth: Payer: Self-pay | Admitting: *Deleted

## 2023-05-01 NOTE — Telephone Encounter (Signed)
Patient called me this morning asking about rescheduling of surgery from [facility] to [facility] on 05/09/23 due to computer issues Friday. She asked about the Ensure  drink and other things related to pre-op. I told her I thought someone from [facility] would call her soon to go over times and what to do about that. She mentioned she has gone to Urgent Care this morning due to her whole family has strep and she was not feeling well. They are doing an overnight test to verify, but provider was pretty sure she has it and an ear infection. Started her on antibiotics. She asked if she would still be ok to have surgery on 05/09/23. I told her I'd let you know. Thanks.

## 2023-05-03 ENCOUNTER — Telehealth: Payer: Self-pay | Admitting: *Deleted

## 2023-05-03 NOTE — Telephone Encounter (Signed)
Patient called again today anxious about hearing from [facility] pre-admission testing regarding what to do/times/Ensure, etc related to her new surgery date on 05/09/23 at [facility]. I spoke with Misty Stanley, pre-admission nurse, who in turn spoke with Anesthesia about this as well as patient's exposure to strep with symptoms earlier in the week and starting of antibiotic by Urgent Care. Heard back from [place], who states anesthesia would prefer this be treated just as symptoms of a URI- cancel surgery and reschedule for 4 weeks out or 2 weeks after completion of abx. Patient is aware that surgery now canceled and her Amoxicillin will be completed next Wednesday, 05/10/23. Thanks.

## 2023-05-09 ENCOUNTER — Ambulatory Visit (HOSPITAL_COMMUNITY): Admission: RE | Admit: 2023-05-09 | Payer: Medicare Other | Source: Home / Self Care | Admitting: Orthopaedic Surgery

## 2023-05-09 ENCOUNTER — Other Ambulatory Visit: Payer: Self-pay | Admitting: Orthopaedic Surgery

## 2023-05-09 ENCOUNTER — Telehealth: Payer: Self-pay | Admitting: *Deleted

## 2023-05-09 ENCOUNTER — Encounter (HOSPITAL_COMMUNITY): Admission: RE | Payer: Self-pay | Source: Home / Self Care

## 2023-05-09 DIAGNOSIS — M1611 Unilateral primary osteoarthritis, right hip: Secondary | ICD-10-CM

## 2023-05-09 SURGERY — ARTHROPLASTY, HIP, TOTAL, ANTERIOR APPROACH
Anesthesia: Spinal | Site: Hip | Laterality: Right

## 2023-05-09 MED ORDER — TRAMADOL HCL 50 MG PO TABS: 100.0000 mg | ORAL_TABLET | Freq: Four times a day (QID) | ORAL | 0 refills | Status: DC | PRN

## 2023-05-09 NOTE — Telephone Encounter (Signed)
I called patient and rescheduled surgery to 06/09/23.

## 2023-05-09 NOTE — Telephone Encounter (Signed)
This patient has called again and seemed very upset that she hasn't heard from anyone regarding surgery. I explained to her that she is still on antibiotics due to a strep infection until tomorrow (which was not our fault) and that someone would be in touch. She states she is in a lot of pain and asked if there was anything that could be sent in for her until her surgery is scheduled. I told her all I could do is send a message to you and Waldo Laine to see about pain medication and scheduling. I also explained that the worldwide computer outage also put others having to be rescheduled as well and not just her. Thanks.

## 2023-05-09 NOTE — Telephone Encounter (Signed)
Patient is aware. She states her PCP did put her on something for sleep. Because I can't see this in the chart, I asked her to check with her pharmacy to make sure there are no interactions. She will.

## 2023-05-11 ENCOUNTER — Encounter: Payer: Medicare Other | Admitting: Orthopaedic Surgery

## 2023-05-23 ENCOUNTER — Encounter: Payer: Medicare Other | Admitting: Orthopaedic Surgery

## 2023-05-27 NOTE — Patient Instructions (Signed)
SURGICAL WAITING ROOM VISITATION Patients having surgery or a procedure may have no more than 2 support people in the waiting area - these visitors may rotate in the visitor waiting room.   Due to an increase in RSV and influenza rates and associated hospitalizations, children ages 69 and under may not visit patients in [facility] hospitals. If the patient needs to stay at the hospital during part of their recovery, the visitor guidelines for inpatient rooms apply.  PRE-OP VISITATION  Pre-op nurse will coordinate an appropriate time for 1 support person to accompany the patient in pre-op.  This support person may not rotate.  This visitor will be contacted when the time is appropriate for the visitor to come back in the pre-op area.  Please refer to the [facility] website for the visitor guidelines for Inpatients (after your surgery is over and you are in a regular room).  You are not required to quarantine at this time prior to your surgery. However, you must do this: Hand Hygiene often Do NOT share personal items Notify your provider if you are in close contact with someone who has COVID or you develop fever 100.4 or greater, new onset of sneezing, cough, sore throat, shortness of breath or body aches.  If you test positive for Covid or have been in contact with anyone that has tested positive in the last 10 days please notify you surgeon.    Your procedure is scheduled on:  Friday June 09, 2023  Report to [facility] Main Entrance: [place] entrance where the [employer] is available.   Report to admitting at:  09:45   AM  Call this number if you have any questions or problems the morning of surgery [phone]  Do not eat food after Midnight the night prior to your surgery/procedure.  After Midnight you may have the following liquids until   0915  AM DAY OF SURGERY  Clear Liquid Diet Water Black Coffee (sugar ok, NO MILK/CREAM OR CREAMERS)  Tea (sugar ok, NO  MILK/CREAM OR CREAMERS) regular and decaf                             Plain Jell-O  with no fruit (NO RED)                                           Fruit ices (not with fruit pulp, NO RED)                                     Popsicles (NO RED)                                                                  Juice: NO CITRUS JUICES: only apple, WHITE grape, WHITE cranberry Sports drinks like Gatorade or Powerade (NO RED)                   The day of surgery:  Drink ONE (1) Pre-Surgery Clear Ensure at 09:15  AM the morning of surgery.  Drink in one sitting. Do not sip.  This drink was given to you during your hospital pre-op appointment visit. Nothing else to drink after completing the Pre-Surgery Clear Ensure : No candy, chewing gum or throat lozenges.    FOLLOW ANY ADDITIONAL PRE OP INSTRUCTIONS YOU RECEIVED FROM YOUR SURGEON'S OFFICE!!!   Oral Hygiene is also important to reduce your risk of infection.        Remember - BRUSH YOUR TEETH THE MORNING OF SURGERY WITH YOUR REGULAR TOOTHPASTE  Do NOT smoke after Midnight the night before surgery.  STOP TAKING all Vitamins, Herbs and supplements 1 week before your surgery.   Take ONLY these medicines the morning of surgery with A SIP OF WATER: Cetirizine (Zyrtec), You may take Tylenol or Tramadol if needed for pain.  You may use your Flonase Nasal spray and you may use Albuterol inhaler / nebulizer if needed.   You may not have any metal on your body including hair pins, jewelry, and body piercing  Do not wear make-up, lotions, powders, perfumes or deodorant  Do not wear nail polish including gel and S&S, artificial / acrylic nails, or any other type of covering on natural nails including finger and toenails. If you have artificial nails, gel coating, etc., that needs to be removed by a nail salon, Please have this removed prior to surgery. Not doing so may mean that your surgery could be cancelled or delayed if the Surgeon or anesthesia  staff feels like they are unable to monitor you safely.   Do not shave 48 hours prior to surgery to avoid nicks in your skin which may contribute to postoperative infections.   Contacts, Hearing Aids, dentures or bridgework may not be worn into surgery. DENTURES WILL BE REMOVED PRIOR TO SURGERY PLEASE DO NOT APPLY "Poly grip" OR ADHESIVES!!!  You may bring a small overnight bag with you on the day of surgery, only pack items that are not valuable. [facility] IS NOT RESPONSIBLE   FOR VALUABLES THAT ARE LOST OR STOLEN.    Do not bring your home medications to the hospital. The Pharmacy will dispense medications listed on your medication list to you during your admission in the Hospital.  Special Instructions: Bring a copy of your healthcare power of attorney and living will documents the day of surgery, if you wish to have them scanned into your [facility] Records- EPIC  Please read over the following fact sheets you were given: IF YOU HAVE QUESTIONS ABOUT YOUR PRE-OP INSTRUCTIONS, PLEASE CALL [phone].     Pre-operative 5 CHG Bath Instructions   You can play a key role in reducing the risk of infection after surgery. Your skin needs to be as free of germs as possible. You can reduce the number of germs on your skin by washing with CHG (chlorhexidine gluconate) soap before surgery. CHG is an antiseptic soap that kills germs and continues to kill germs even after washing.   DO NOT use if you have an allergy to chlorhexidine/CHG or antibacterial soaps. If your skin becomes reddened or irritated, stop using the CHG and notify one of our RNs at [phone]  Please shower with the CHG soap starting 4 days before surgery using the following schedule: START SHOWERS ON   MONDAY  June 05, 2023  Please  keep in mind the following:  DO NOT shave, including legs and underarms, starting the day of your first shower.   You may shave your face at any point before/day of surgery.   Place clean sheets on your bed the day you start using CHG soap. Use a clean washcloth (not used since being washed) for each shower. DO NOT sleep with pets once you start using the CHG.   CHG Shower Instructions:  If you choose to wash your hair and private area, wash first with your normal shampoo/soap.  After you use shampoo/soap, rinse your hair and body thoroughly to remove shampoo/soap residue.  Turn the water OFF and apply about 3 tablespoons (45 ml) of CHG soap to a CLEAN washcloth.  Apply CHG soap ONLY FROM YOUR NECK DOWN TO YOUR TOES (washing for 3-5 minutes)  DO NOT use CHG soap on face, private areas, open wounds, or sores.  Pay special attention to the area where your surgery is being performed.  If you are having back surgery, having someone wash your back for you may be helpful.  Wait 2 minutes after CHG soap is applied, then you may rinse off the CHG soap.  Pat dry with a clean towel  Put on clean clothes/pajamas   If you choose to wear lotion, please use ONLY the CHG-compatible lotions on the back of this paper.     Additional instructions for the day of surgery: DO NOT APPLY any lotions, deodorants, cologne, or perfumes.   Put on clean/comfortable clothes.  Brush your teeth.  Ask your nurse before applying any prescription medications to the skin.      CHG Compatible Lotions   Aveeno Moisturizing lotion  Cetaphil Moisturizing Cream  Cetaphil Moisturizing Lotion  Clairol Herbal Essence Moisturizing Lotion, Dry Skin  Clairol Herbal Essence Moisturizing Lotion, Extra Dry Skin  Clairol Herbal Essence Moisturizing Lotion, Normal Skin  Curel Age Defying Therapeutic Moisturizing Lotion with Alpha Hydroxy  Curel Extreme Care Body Lotion  Curel Soothing Hands Moisturizing Hand Lotion  Curel  Therapeutic Moisturizing Cream, Fragrance-Free  Curel Therapeutic Moisturizing Lotion, Fragrance-Free  Curel Therapeutic Moisturizing Lotion, Original Formula  Eucerin Daily Replenishing Lotion  Eucerin Dry Skin Therapy Plus Alpha Hydroxy Crme  Eucerin Dry Skin Therapy Plus Alpha Hydroxy Lotion  Eucerin Original Crme  Eucerin Original Lotion  Eucerin Plus Crme Eucerin Plus Lotion  Eucerin TriLipid Replenishing Lotion  Keri Anti-Bacterial Hand Lotion  Keri Deep Conditioning Original Lotion Dry Skin Formula Softly Scented  Keri Deep Conditioning Original Lotion, Fragrance Free Sensitive Skin Formula  Keri Lotion Fast Absorbing Fragrance Free Sensitive Skin Formula  Keri Lotion Fast Absorbing Softly Scented Dry Skin Formula  Keri Original Lotion  Keri Skin Renewal Lotion Keri Silky Smooth Lotion  Keri Silky Smooth Sensitive Skin Lotion  Nivea Body Creamy Conditioning Oil  Nivea Body Extra Enriched Lotion  Nivea Body Original Lotion  Nivea Body Sheer Moisturizing Lotion Nivea Crme  Nivea Skin Firming Lotion  NutraDerm 30 Skin Lotion  NutraDerm Skin Lotion  NutraDerm Therapeutic Skin Cream  NutraDerm Therapeutic Skin Lotion  ProShield Protective Hand Cream  Provon moisturizing lotion   FAILURE TO FOLLOW THESE INSTRUCTIONS MAY RESULT IN THE CANCELLATION OF YOUR SURGERY  PATIENT SIGNATURE_________________________________  NURSE SIGNATURE__________________________________  ________________________________________________________________________       Lindsay Watts    An incentive spirometer is a tool that can help keep your lungs clear and active. This tool measures how well you are filling your lungs with each breath. Taking  long deep breaths may help reverse or decrease the chance of developing breathing (pulmonary) problems (especially infection) following: A long period of time when you are unable to move or be active. BEFORE THE PROCEDURE  If the spirometer  includes an indicator to show your best effort, your nurse or respiratory therapist will set it to a desired goal. If possible, sit up straight or lean slightly forward. Try not to slouch. Hold the incentive spirometer in an upright position. INSTRUCTIONS FOR USE  Sit on the edge of your bed if possible, or sit up as far as you can in bed or on a chair. Hold the incentive spirometer in an upright position. Breathe out normally. Place the mouthpiece in your mouth and seal your lips tightly around it. Breathe in slowly and as deeply as possible, raising the piston or the ball toward the top of the column. Hold your breath for 3-5 seconds or for as long as possible. Allow the piston or ball to fall to the bottom of the column. Remove the mouthpiece from your mouth and breathe out normally. Rest for a few seconds and repeat Steps 1 through 7 at least 10 times every 1-2 hours when you are awake. Take your time and take a few normal breaths between deep breaths. The spirometer may include an indicator to show your best effort. Use the indicator as a goal to work toward during each repetition. After each set of 10 deep breaths, practice coughing to be sure your lungs are clear. If you have an incision (the cut made at the time of surgery), support your incision when coughing by placing a pillow or rolled up towels firmly against it. Once you are able to get out of bed, walk around indoors and cough well. You may stop using the incentive spirometer when instructed by your caregiver.  RISKS AND COMPLICATIONS Take your time so you do not get dizzy or light-headed. If you are in pain, you may need to take or ask for pain medication before doing incentive spirometry. It is harder to take a deep breath if you are having pain. AFTER USE Rest and breathe slowly and easily. It can be helpful to keep track of a log of your progress. Your caregiver can provide you with a simple table to help with this. If you are  using the spirometer at home, follow these instructions: SEEK MEDICAL CARE IF:  You are having difficultly using the spirometer. You have trouble using the spirometer as often as instructed. Your pain medication is not giving enough relief while using the spirometer. You develop fever of 100.5 F (38.1 C) or higher.                                                                                                    SEEK IMMEDIATE MEDICAL CARE IF:  You cough up bloody sputum that had not been present before. You develop fever of 102 F (38.9 C) or greater. You develop worsening pain at or near the incision site. MAKE SURE YOU:  Understand these instructions. Will watch your condition.  Will get help right away if you are not doing well or get worse. Document Released: 02/06/2007 Document Revised: 12/19/2011 Document Reviewed: 04/09/2007 [facility] Patient Information 2014 [place], [place].    WHAT IS A BLOOD TRANSFUSION? Blood Transfusion Information  A transfusion is the replacement of blood or some of its parts. Blood is made up of multiple cells which provide different functions. Red blood cells carry oxygen and are used for blood loss replacement. White blood cells fight against infection. Platelets control bleeding. Plasma helps clot blood. Other blood products are available for specialized needs, such as hemophilia or other clotting disorders. BEFORE THE TRANSFUSION  Who gives blood for transfusions?  Healthy volunteers who are fully evaluated to make sure their blood is safe. This is blood bank blood. Transfusion therapy is the safest it has ever been in the practice of medicine. Before blood is taken from a donor, a complete history is taken to make sure that person has no history of diseases nor engages in risky social behavior (examples are intravenous drug use or sexual activity with multiple partners). The donor's travel history is screened to minimize risk of transmitting  infections, such as malaria. The donated blood is tested for signs of infectious diseases, such as HIV and hepatitis. The blood is then tested to be sure it is compatible with you in order to minimize the chance of a transfusion reaction. If you or a relative donates blood, this is often done in anticipation of surgery and is not appropriate for emergency situations. It takes many days to process the donated blood. RISKS AND COMPLICATIONS Although transfusion therapy is very safe and saves many lives, the main dangers of transfusion include:  Getting an infectious disease. Developing a transfusion reaction. This is an allergic reaction to something in the blood you were given. Every precaution is taken to prevent this. The decision to have a blood transfusion has been considered carefully by your caregiver before blood is given. Blood is not given unless the benefits outweigh the risks. AFTER THE TRANSFUSION Right after receiving a blood transfusion, you will usually feel much better and more energetic. This is especially true if your red blood cells have gotten low (anemic). The transfusion raises the level of the red blood cells which carry oxygen, and this usually causes an energy increase. The nurse administering the transfusion will monitor you carefully for complications. HOME CARE INSTRUCTIONS  No special instructions are needed after a transfusion. You may find your energy is better. Speak with your caregiver about any limitations on activity for underlying diseases you may have. SEEK MEDICAL CARE IF:  Your condition is not improving after your transfusion. You develop redness or irritation at the intravenous (IV) site. SEEK IMMEDIATE MEDICAL CARE IF:  Any of the following symptoms occur over the next 12 hours: Shaking chills. You have a temperature by mouth above 102 F (38.9 C), not controlled by medicine. Chest, back, or muscle pain. People around you feel you are not acting correctly  or are confused. Shortness of breath or difficulty breathing. Dizziness and fainting. You get a rash or develop hives. You have a decrease in urine output. Your urine turns a dark color or changes to pink, red, or brown. Any of the following symptoms occur over the next 10 days: You have a temperature by mouth above 102 F (38.9 C), not controlled by medicine. Shortness of breath. Weakness after normal activity. The white part of the eye turns yellow (jaundice). You have a decrease in  the amount of urine or are urinating less often. Your urine turns a dark color or changes to pink, red, or brown. Document Released: 09/23/2000 Document Revised: 12/19/2011 Document Reviewed: 05/12/2008 [facility] Patient Information 2014 [place], [place].  _______________________________________________________________________

## 2023-05-27 NOTE — Progress Notes (Signed)
COVID Vaccine received:  []  No [x]  Yes Date of any COVID positive Test in last 90 days:  PCP - Irven Coe, MD   at [facility] Cardiologist - none  Chest x-ray - 10-14-2022  2v  Epic EKG -  10-17-2022  Epic Stress Test - 11-16-2011  Epic ECHO - 11-16-2011  Epic   normal findings Cardiac Cath -  Cardiac CT- 08-15-2022  score of 0   Epic  PCR screen: [x]  Ordered & Completed           []   No Order but Needs PROFEND           []   N/A for this surgery  Surgery Plan:  []  Ambulatory                            [x]  Outpatient in bed                            []  Admit  Anesthesia:    []  General  [x]  Spinal                           []   Choice []   MAC  Pacemaker / ICD device [x]  No []  Yes   Spinal Cord Stimulator:[x]  No []  Yes       History of Sleep Apnea? [x]  No []  Yes   CPAP used?- [x]  No []  Yes    Does the patient monitor blood sugar?          []  No []  Yes  [x]  N/A  Patient has: [x]  NO Hx DM   []  Pre-DM                 []  DM1  []   DM2  Blood Thinner / Instructions:  none Aspirin Instructions: none  ERAS Protocol Ordered: []  No  [x]  Yes PRE-SURGERY [x]  ENSURE  []  G2  Patient is to be NPO after: 0915  Comments: Patient was given the 5 CHG shower / bath instructions for THA  surgery along with 2 bottles of the CHG soap. Patient will start this on: Monday 06-05-23  All questions were asked and answered, Patient voiced understanding of this process.   Activity level: Patient is able to climb a flight of stairs without difficulty; []  No CP  []  No SOB, but would have leg pain.  Patient can not perform ADLs without assistance.   Anesthesia review: asthma, PONV, fibromyalgia, anxiety  Patient denies shortness of breath, fever, cough and chest pain at PAT appointment.  Patient verbalized understanding and agreement to the Pre-Surgical Instructions that were given to them at this PAT appointment. Patient was also educated of the need to review these PAT instructions again prior to her  surgery.I reviewed the appropriate phone numbers to call if they have any and questions or concerns.

## 2023-05-30 ENCOUNTER — Other Ambulatory Visit: Payer: Self-pay

## 2023-05-30 ENCOUNTER — Encounter (HOSPITAL_COMMUNITY): Payer: Self-pay

## 2023-05-30 ENCOUNTER — Encounter (HOSPITAL_COMMUNITY)
Admission: RE | Admit: 2023-05-30 | Discharge: 2023-05-30 | Disposition: A | Discharge status: Home / Self Care | Payer: Medicare Other | Source: Ambulatory Visit | Attending: Orthopaedic Surgery | Admitting: Orthopaedic Surgery

## 2023-05-30 VITALS — BP 138/82 | HR 88 | Temp 98.1°F | Resp 18 | Ht 62.0 in | Wt 165.0 lb

## 2023-05-30 DIAGNOSIS — M1611 Unilateral primary osteoarthritis, right hip: Secondary | ICD-10-CM | POA: Diagnosis not present

## 2023-05-30 DIAGNOSIS — Z01812 Encounter for preprocedural laboratory examination: Secondary | ICD-10-CM | POA: Diagnosis present

## 2023-05-30 DIAGNOSIS — Z01818 Encounter for other preprocedural examination: Secondary | ICD-10-CM

## 2023-05-30 HISTORY — DX: Anxiety disorder, unspecified: F41.9

## 2023-05-30 HISTORY — DX: Family history of other specified conditions: Z84.89

## 2023-05-30 LAB — COMPREHENSIVE METABOLIC PANEL
ALT: 14 U/L (ref 0–44)
AST: 16 U/L (ref 15–41)
Albumin: 4 g/dL (ref 3.5–5.0)
Alkaline Phosphatase: 77 U/L (ref 38–126)
Anion gap: 9 (ref 5–15)
BUN: 16 mg/dL (ref 8–23)
CO2: 26 mmol/L (ref 22–32)
Calcium: 9.4 mg/dL (ref 8.9–10.3)
Chloride: 103 mmol/L (ref 98–111)
Creatinine, Ser: 0.67 mg/dL (ref 0.44–1.00)
GFR, Estimated: >60 mL/min (ref >60)
Glucose, Bld: 90 mg/dL (ref 70–99)
Potassium: 3.9 mmol/L (ref 3.5–5.1)
Sodium: 138 mmol/L (ref 135–145)
Total Bilirubin: 0.7 mg/dL (ref 0.3–1.2)
Total Protein: 7.1 g/dL (ref 6.5–8.1)

## 2023-05-30 LAB — CBC
HCT: 44 % (ref 36.0–46.0)
Hemoglobin: 14.3 g/dL (ref 12.0–15.0)
MCH: 30.4 pg (ref 26.0–34.0)
MCHC: 32.5 g/dL (ref 30.0–36.0)
MCV: 93.6 fL (ref 80.0–100.0)
Platelets: 339 10*3/uL (ref 150–400)
RBC: 4.7 MIL/uL (ref 3.87–5.11)
RDW: 11.9 % (ref 11.5–15.5)
WBC: 8.7 10*3/uL (ref 4.0–10.5)
nRBC: 0 % (ref 0.0–0.2)

## 2023-05-30 LAB — SURGICAL PCR SCREEN
MRSA, PCR: NEGATIVE
Staphylococcus aureus: NEGATIVE

## 2023-06-05 ENCOUNTER — Telehealth: Payer: Self-pay | Admitting: *Deleted

## 2023-06-05 NOTE — Telephone Encounter (Signed)
Patient called requested a call back. Surgery has been rescheduled for 06/09/23 with Dr. Magnus Ivan. Called back and left VM.

## 2023-06-07 ENCOUNTER — Telehealth: Payer: Self-pay | Admitting: *Deleted

## 2023-06-07 NOTE — Telephone Encounter (Signed)
Will make patient aware

## 2023-06-07 NOTE — Telephone Encounter (Signed)
Patient and I talked today again about her upcoming surgery. She is having anxiety almost the same she mentions as if she were having an MRI or flying. She asked if there was anything she could take morning of surgery or beforehand that would help her. I explained that anesthesia would most likely be giving something to relax her in pre-op or before procedure, but I'd discuss with you. Anything we can recommend or prescribe for before surgery on morning of since she doesn't have surgery until noon? Thank you.

## 2023-06-08 ENCOUNTER — Other Ambulatory Visit: Payer: Self-pay | Admitting: Orthopaedic Surgery

## 2023-06-08 ENCOUNTER — Telehealth: Payer: Self-pay | Admitting: Orthopaedic Surgery

## 2023-06-08 MED ORDER — DIAZEPAM 5 MG PO TABS: 5.0000 mg | ORAL_TABLET | Freq: Once | ORAL | 0 refills | Status: AC

## 2023-06-08 NOTE — Telephone Encounter (Signed)
Pt called stating she spoke with the anaesthesiologist and they confirmed that Dr. Magnus Ivan can send in medication for pt to take before her surgery tomorrow due to her having panic attacks and the medication will calm her down enough to be prepared for surgery.

## 2023-06-08 NOTE — H&P (Signed)
TOTAL HIP ADMISSION H&P  Patient is admitted for right total hip arthroplasty.  Subjective:  Chief Complaint: right hip pain  HPI: Lindsay Watts, 67 y.o. female, has a history of pain and functional disability in the right hip(s) due to arthritis and patient has failed non-surgical conservative treatments for greater than 12 weeks to include NSAID's and/or analgesics, corticosteriod injections, flexibility and strengthening excercises, and activity modification.  Onset of symptoms was gradual starting 2 years ago with gradually worsening course since that time.The patient noted no past surgery on the right hip(s).  Patient currently rates pain in the right hip at 10 out of 10 with activity. Patient has night pain, worsening of pain with activity and weight bearing, pain that interfers with activities of daily living, and pain with passive range of motion. Patient has evidence of subchondral cysts, periarticular osteophytes, and joint space narrowing by imaging studies. This condition presents safety issues increasing the risk of falls.  There is no current active infection.  Patient Active Problem List   Diagnosis Date Noted   Unilateral primary osteoarthritis, right hip 02/13/2023   Past Medical History:  Diagnosis Date   Anxiety    Arthritis    Asthma    Family history of adverse reaction to anesthesia    mother has severe PONV   Fibromyalgia    GERD (gastroesophageal reflux disease)    PONV (postoperative nausea and vomiting)     Past Surgical History:  Procedure Laterality Date   ANKLE SURGERY     APPENDECTOMY     CERVICAL SPINE SURGERY     CESAREAN SECTION     Cyst removal arm     CYST REMOVAL HAND     DILATION AND CURETTAGE OF UTERUS     EYE SURGERY     MENISCUS REPAIR Bilateral    OVARY SURGERY      No current facility-administered medications for this encounter.   Current Outpatient Medications  Medication Sig Dispense Refill Last Dose   acetaminophen (TYLENOL) 500 MG  tablet Take 1,000 mg by mouth every 6 (six) hours as needed for moderate pain or mild pain.      albuterol (PROVENTIL HFA;VENTOLIN HFA) 108 (90 BASE) MCG/ACT inhaler Inhale 2 puffs into the lungs every 6 (six) hours as needed for wheezing or shortness of breath. For shortness of breath      albuterol (PROVENTIL) (2.5 MG/3ML) 0.083% nebulizer solution Take 2.5 mg by nebulization every 6 (six) hours as needed for wheezing or shortness of breath.      cetirizine (ZYRTEC) 10 MG tablet Take 10 mg by mouth daily as needed for allergies.      fluticasone (FLONASE) 50 MCG/ACT nasal spray Place 1 spray into both nostrils daily.      ibuprofen (ADVIL) 200 MG tablet Take 400 mg by mouth daily as needed for mild pain or moderate pain.      traMADol (ULTRAM) 50 MG tablet Take 2 tablets (100 mg total) by mouth every 6 (six) hours as needed. 40 tablet 0    zolpidem (AMBIEN) 5 MG tablet Take 5 mg by mouth at bedtime as needed for sleep.      ascorbic acid (VITAMIN C) 500 MG tablet Take 500 mg by mouth daily.      calcium carbonate (OSCAL) 1500 (600 Ca) MG TABS tablet Take 600 mg of elemental calcium by mouth daily.      cholecalciferol (VITAMIN D3) 25 MCG (1000 UNIT) tablet Take 1,000 Units by mouth daily.  diazepam (VALIUM) 5 MG tablet Take 1 tablet (5 mg total) by mouth once for 1 dose. Take one tablet the morning of surgery with a sip of water. 1 tablet 0    Multiple Vitamins-Minerals (MULTIVITAMIN WITH MINERALS) tablet Take 1 tablet by mouth daily.      vitamin B-12 (CYANOCOBALAMIN) 500 MCG tablet Take 500 mcg by mouth daily.      vitamin E 180 MG (400 UNITS) capsule Take 400 Units by mouth daily.      Allergies  Allergen Reactions   Aspirin Shortness Of Breath   Ibuprofen Shortness Of Breath   Latex Itching    Social History   Tobacco Use   Smoking status: Never   Smokeless tobacco: Never  Substance Use Topics   Alcohol use: No    Family History  Problem Relation Age of Onset   Breast cancer  Mother        diagnosed in her 10's     Review of Systems  Objective:  Physical Exam Vitals reviewed.  Constitutional:      Appearance: Normal appearance. She is normal weight.  HENT:     Head: Normocephalic and atraumatic.  Eyes:     Extraocular Movements: Extraocular movements intact.     Pupils: Pupils are equal, round, and reactive to light.  Cardiovascular:     Rate and Rhythm: Normal rate and regular rhythm.     Pulses: Normal pulses.  Pulmonary:     Effort: Pulmonary effort is normal.     Breath sounds: Normal breath sounds.  Abdominal:     Palpations: Abdomen is soft.  Musculoskeletal:     Cervical back: Normal range of motion and neck supple.     Right hip: Tenderness and bony tenderness present. Decreased range of motion. Decreased strength.  Neurological:     Mental Status: She is alert and oriented to person, place, and time.  Psychiatric:        Behavior: Behavior normal.     Vital signs in last 24 hours:    Labs:   Estimated body mass index is 30.18 kg/m as calculated from the following:   Height as of 05/30/23: 5\' 2"  (1.575 m).   Weight as of 05/30/23: 74.8 kg.   Imaging Review Plain radiographs demonstrate severe degenerative joint disease of the right hip(s). The bone quality appears to be excellent for age and reported activity level.      Assessment/Plan:  End stage arthritis, right hip(s)  The patient history, physical examination, clinical judgement of the provider and imaging studies are consistent with end stage degenerative joint disease of the right hip(s) and total hip arthroplasty is deemed medically necessary. The treatment options including medical management, injection therapy, arthroscopy and arthroplasty were discussed at length. The risks and benefits of total hip arthroplasty were presented and reviewed. The risks due to aseptic loosening, infection, stiffness, dislocation/subluxation,  thromboembolic complications and other  imponderables were discussed.  The patient acknowledged the explanation, agreed to proceed with the plan and consent was signed. Patient is being admitted for inpatient treatment for surgery, pain control, PT, OT, prophylactic antibiotics, VTE prophylaxis, progressive ambulation and ADL's and discharge planning.The patient is planning to be discharged home with home health services

## 2023-06-09 ENCOUNTER — Encounter (HOSPITAL_COMMUNITY): Payer: Self-pay | Admitting: Orthopaedic Surgery

## 2023-06-09 ENCOUNTER — Encounter (HOSPITAL_COMMUNITY)
Admission: RE | Disposition: A | Discharge status: Home Health | Payer: Self-pay | Source: Ambulatory Visit | Attending: Orthopaedic Surgery

## 2023-06-09 ENCOUNTER — Ambulatory Visit (HOSPITAL_COMMUNITY): Payer: Medicare Other | Admitting: Physician Assistant

## 2023-06-09 ENCOUNTER — Ambulatory Visit (HOSPITAL_COMMUNITY): Payer: Medicare Other

## 2023-06-09 ENCOUNTER — Other Ambulatory Visit: Payer: Self-pay

## 2023-06-09 ENCOUNTER — Observation Stay (HOSPITAL_COMMUNITY): Payer: Medicare Other

## 2023-06-09 ENCOUNTER — Inpatient Hospital Stay (HOSPITAL_COMMUNITY)
Admission: RE | Admit: 2023-06-09 | Discharge: 2023-06-12 | DRG: 470 | Disposition: A | Discharge status: Home Health | Payer: Medicare Other | Source: Ambulatory Visit | Attending: Orthopaedic Surgery | Admitting: Orthopaedic Surgery

## 2023-06-09 DIAGNOSIS — M797 Fibromyalgia: Secondary | ICD-10-CM | POA: Diagnosis present

## 2023-06-09 DIAGNOSIS — I951 Orthostatic hypotension: Secondary | ICD-10-CM | POA: Diagnosis present

## 2023-06-09 DIAGNOSIS — F419 Anxiety disorder, unspecified: Secondary | ICD-10-CM | POA: Diagnosis present

## 2023-06-09 DIAGNOSIS — Z96641 Presence of right artificial hip joint: Secondary | ICD-10-CM

## 2023-06-09 DIAGNOSIS — M1611 Unilateral primary osteoarthritis, right hip: Secondary | ICD-10-CM

## 2023-06-09 DIAGNOSIS — Z886 Allergy status to analgesic agent status: Secondary | ICD-10-CM

## 2023-06-09 DIAGNOSIS — Z79899 Other long term (current) drug therapy: Secondary | ICD-10-CM

## 2023-06-09 DIAGNOSIS — Z91040 Latex allergy status: Secondary | ICD-10-CM

## 2023-06-09 HISTORY — PX: TOTAL HIP ARTHROPLASTY: SHX124

## 2023-06-09 LAB — TYPE AND SCREEN
ABO/RH(D): A POS
Antibody Screen: NEGATIVE

## 2023-06-09 SURGERY — ARTHROPLASTY, HIP, TOTAL, ANTERIOR APPROACH
Anesthesia: General | Site: Hip | Laterality: Right

## 2023-06-09 MED ORDER — HYDROMORPHONE HCL 1 MG/ML IJ SOLN
0.2500 mg | INTRAMUSCULAR | Status: DC | PRN
  Administered 2023-06-09: 0.5 mg via INTRAVENOUS

## 2023-06-09 MED ORDER — APIXABAN 2.5 MG PO TABS
2.5000 mg | ORAL_TABLET | Freq: Two times a day (BID) | ORAL | Status: DC
  Administered 2023-06-10 – 2023-06-12 (×5): 2.5 mg via ORAL
  Filled 2023-06-09 (×5): qty 1

## 2023-06-09 MED ORDER — PROPOFOL 1000 MG/100ML IV EMUL
INTRAVENOUS | Status: AC
  Filled 2023-06-09: qty 100

## 2023-06-09 MED ORDER — ZOLPIDEM TARTRATE 5 MG PO TABS: 5.0000 mg | ORAL_TABLET | Freq: Every evening | ORAL | Status: DC | PRN

## 2023-06-09 MED ORDER — CEFAZOLIN SODIUM-DEXTROSE 2-4 GM/100ML-% IV SOLN
2.0000 g | INTRAVENOUS | Status: AC
  Administered 2023-06-09: 2 g via INTRAVENOUS
  Filled 2023-06-09: qty 100

## 2023-06-09 MED ORDER — DIPHENHYDRAMINE HCL 12.5 MG/5ML PO ELIX
12.5000 mg | ORAL_SOLUTION | ORAL | Status: DC | PRN
  Filled 2023-06-09: qty 10

## 2023-06-09 MED ORDER — ACETAMINOPHEN 10 MG/ML IV SOLN
INTRAVENOUS | Status: AC
  Filled 2023-06-09: qty 100

## 2023-06-09 MED ORDER — SODIUM CHLORIDE 0.9 % IV SOLN: INTRAVENOUS | Status: DC

## 2023-06-09 MED ORDER — METHOCARBAMOL 500 MG PO TABS
500.0000 mg | ORAL_TABLET | Freq: Four times a day (QID) | ORAL | Status: DC | PRN
  Administered 2023-06-10: 500 mg via ORAL
  Filled 2023-06-09: qty 1

## 2023-06-09 MED ORDER — LACTATED RINGERS IV SOLN: INTRAVENOUS | Status: DC

## 2023-06-09 MED ORDER — PHENOL 1.4 % MT LIQD: 1.0000 | OROMUCOSAL | Status: DC | PRN

## 2023-06-09 MED ORDER — MIDAZOLAM HCL 2 MG/2ML IJ SOLN
INTRAMUSCULAR | Status: AC
  Filled 2023-06-09: qty 2

## 2023-06-09 MED ORDER — CHLORHEXIDINE GLUCONATE 0.12 % MT SOLN
15.0000 mL | Freq: Once | OROMUCOSAL | Status: AC
  Administered 2023-06-09: 15 mL via OROMUCOSAL

## 2023-06-09 MED ORDER — PROPOFOL 500 MG/50ML IV EMUL
INTRAVENOUS | Status: DC | PRN
  Administered 2023-06-09: 100 ug/kg/min via INTRAVENOUS

## 2023-06-09 MED ORDER — AMISULPRIDE (ANTIEMETIC) 5 MG/2ML IV SOLN
INTRAVENOUS | Status: AC
  Filled 2023-06-09: qty 4

## 2023-06-09 MED ORDER — METHOCARBAMOL 1000 MG/10ML IJ SOLN
500.0000 mg | Freq: Four times a day (QID) | INTRAVENOUS | Status: DC | PRN
  Filled 2023-06-09: qty 5

## 2023-06-09 MED ORDER — POVIDONE-IODINE 10 % EX SWAB: 2.0000 | Freq: Once | CUTANEOUS | Status: DC

## 2023-06-09 MED ORDER — ACETAMINOPHEN 325 MG PO TABS
325.0000 mg | ORAL_TABLET | Freq: Four times a day (QID) | ORAL | Status: DC | PRN
  Administered 2023-06-10 – 2023-06-11 (×3): 650 mg via ORAL
  Administered 2023-06-11: 325 mg via ORAL
  Filled 2023-06-09 (×4): qty 2

## 2023-06-09 MED ORDER — FENTANYL CITRATE PF 50 MCG/ML IJ SOSY
PREFILLED_SYRINGE | INTRAMUSCULAR | Status: AC
  Filled 2023-06-09: qty 1

## 2023-06-09 MED ORDER — FENTANYL CITRATE PF 50 MCG/ML IJ SOSY
25.0000 ug | PREFILLED_SYRINGE | INTRAMUSCULAR | Status: DC | PRN
  Administered 2023-06-09 (×2): 50 ug via INTRAVENOUS

## 2023-06-09 MED ORDER — ALBUTEROL SULFATE (2.5 MG/3ML) 0.083% IN NEBU: 2.5000 mg | INHALATION_SOLUTION | Freq: Four times a day (QID) | RESPIRATORY_TRACT | Status: DC | PRN

## 2023-06-09 MED ORDER — ONDANSETRON HCL 4 MG/2ML IJ SOLN
4.0000 mg | Freq: Four times a day (QID) | INTRAMUSCULAR | Status: DC | PRN
  Administered 2023-06-09 – 2023-06-10 (×3): 4 mg via INTRAVENOUS
  Filled 2023-06-09 (×3): qty 2

## 2023-06-09 MED ORDER — FENTANYL CITRATE PF 50 MCG/ML IJ SOSY
PREFILLED_SYRINGE | INTRAMUSCULAR | Status: AC
  Administered 2023-06-09: 50 ug via INTRAVENOUS
  Filled 2023-06-09: qty 3

## 2023-06-09 MED ORDER — DEXAMETHASONE SODIUM PHOSPHATE 10 MG/ML IJ SOLN
INTRAMUSCULAR | Status: DC | PRN
  Administered 2023-06-09: 10 mg via INTRAVENOUS

## 2023-06-09 MED ORDER — ACETAMINOPHEN 500 MG PO TABS: 1000.0000 mg | ORAL_TABLET | Freq: Once | ORAL | Status: DC | PRN

## 2023-06-09 MED ORDER — MIDAZOLAM HCL 5 MG/5ML IJ SOLN
INTRAMUSCULAR | Status: DC | PRN
  Administered 2023-06-09: 2 mg via INTRAVENOUS

## 2023-06-09 MED ORDER — OXYCODONE HCL 5 MG PO TABS
5.0000 mg | ORAL_TABLET | ORAL | Status: DC | PRN
  Administered 2023-06-10 (×2): 5 mg via ORAL
  Administered 2023-06-11 – 2023-06-12 (×2): 10 mg via ORAL
  Filled 2023-06-09: qty 2
  Filled 2023-06-09 (×2): qty 1
  Filled 2023-06-09 (×3): qty 2

## 2023-06-09 MED ORDER — PANTOPRAZOLE SODIUM 40 MG PO TBEC
40.0000 mg | DELAYED_RELEASE_TABLET | Freq: Every day | ORAL | Status: DC
  Administered 2023-06-10 – 2023-06-12 (×3): 40 mg via ORAL
  Filled 2023-06-09 (×3): qty 1

## 2023-06-09 MED ORDER — DOCUSATE SODIUM 100 MG PO CAPS
100.0000 mg | ORAL_CAPSULE | Freq: Two times a day (BID) | ORAL | Status: DC
  Administered 2023-06-10 – 2023-06-12 (×4): 100 mg via ORAL
  Filled 2023-06-09 (×5): qty 1

## 2023-06-09 MED ORDER — PROPOFOL 10 MG/ML IV BOLUS
INTRAVENOUS | Status: DC | PRN
  Administered 2023-06-09: 160 mg via INTRAVENOUS
  Administered 2023-06-09 (×4): 20 mg via INTRAVENOUS

## 2023-06-09 MED ORDER — HYDROMORPHONE HCL 1 MG/ML IJ SOLN
INTRAMUSCULAR | Status: AC
  Administered 2023-06-09: 0.5 mg via INTRAVENOUS
  Filled 2023-06-09: qty 1

## 2023-06-09 MED ORDER — OXYCODONE HCL 5 MG/5ML PO SOLN: 5.0000 mg | Freq: Once | ORAL | Status: DC | PRN

## 2023-06-09 MED ORDER — PHENYLEPHRINE HCL-NACL 20-0.9 MG/250ML-% IV SOLN
INTRAVENOUS | Status: AC
  Filled 2023-06-09: qty 250

## 2023-06-09 MED ORDER — OXYCODONE HCL 5 MG PO TABS
10.0000 mg | ORAL_TABLET | ORAL | Status: DC | PRN
  Administered 2023-06-10 – 2023-06-11 (×5): 10 mg via ORAL
  Filled 2023-06-09 (×3): qty 2

## 2023-06-09 MED ORDER — STERILE WATER FOR IRRIGATION IR SOLN
Status: DC | PRN
Start: 2023-06-09 — End: 2023-06-09
  Administered 2023-06-09: 2000 mL

## 2023-06-09 MED ORDER — ACETAMINOPHEN 160 MG/5ML PO SOLN: 1000.0000 mg | Freq: Once | ORAL | Status: DC | PRN

## 2023-06-09 MED ORDER — SODIUM CHLORIDE 0.9 % IR SOLN
Status: DC | PRN
  Administered 2023-06-09: 1000 mL

## 2023-06-09 MED ORDER — MIDAZOLAM BOLUS VIA INFUSION
1.0000 mg | Freq: Once | INTRAVENOUS | Status: DC
Start: 2023-06-09 — End: 2023-06-09

## 2023-06-09 MED ORDER — 0.9 % SODIUM CHLORIDE (POUR BTL) OPTIME
TOPICAL | Status: DC | PRN
  Administered 2023-06-09: 1000 mL

## 2023-06-09 MED ORDER — FENTANYL CITRATE (PF) 100 MCG/2ML IJ SOLN
INTRAMUSCULAR | Status: DC | PRN
  Administered 2023-06-09: 100 ug via INTRAVENOUS

## 2023-06-09 MED ORDER — ORAL CARE MOUTH RINSE: 15.0000 mL | Freq: Once | OROMUCOSAL | Status: AC

## 2023-06-09 MED ORDER — FENTANYL CITRATE (PF) 100 MCG/2ML IJ SOLN
INTRAMUSCULAR | Status: AC
  Filled 2023-06-09: qty 2

## 2023-06-09 MED ORDER — ONDANSETRON HCL 4 MG PO TABS: 4.0000 mg | ORAL_TABLET | Freq: Four times a day (QID) | ORAL | Status: DC | PRN

## 2023-06-09 MED ORDER — ALUM & MAG HYDROXIDE-SIMETH 200-200-20 MG/5ML PO SUSP: 30.0000 mL | ORAL | Status: DC | PRN

## 2023-06-09 MED ORDER — TRANEXAMIC ACID-NACL 1000-0.7 MG/100ML-% IV SOLN
1000.0000 mg | INTRAVENOUS | Status: AC
  Administered 2023-06-09: 1000 mg via INTRAVENOUS
  Filled 2023-06-09: qty 100

## 2023-06-09 MED ORDER — ALBUTEROL SULFATE HFA 108 (90 BASE) MCG/ACT IN AERS: 2.0000 | INHALATION_SPRAY | Freq: Four times a day (QID) | RESPIRATORY_TRACT | Status: DC | PRN

## 2023-06-09 MED ORDER — OXYCODONE HCL 5 MG PO TABS: 5.0000 mg | ORAL_TABLET | Freq: Once | ORAL | Status: DC | PRN

## 2023-06-09 MED ORDER — ACETAMINOPHEN 10 MG/ML IV SOLN
1000.0000 mg | Freq: Once | INTRAVENOUS | Status: DC | PRN
  Administered 2023-06-09: 1000 mg via INTRAVENOUS

## 2023-06-09 MED ORDER — HYDROMORPHONE HCL 1 MG/ML IJ SOLN
0.5000 mg | INTRAMUSCULAR | Status: DC | PRN
  Administered 2023-06-09 – 2023-06-10 (×3): 1 mg via INTRAVENOUS
  Filled 2023-06-09 (×4): qty 1

## 2023-06-09 MED ORDER — MIDAZOLAM HCL 2 MG/2ML IJ SOLN
1.0000 mg | Freq: Once | INTRAMUSCULAR | Status: AC
  Administered 2023-06-09: 1 mg via INTRAVENOUS

## 2023-06-09 MED ORDER — AMISULPRIDE (ANTIEMETIC) 5 MG/2ML IV SOLN
10.0000 mg | Freq: Once | INTRAVENOUS | Status: AC
  Administered 2023-06-09: 10 mg via INTRAVENOUS

## 2023-06-09 MED ORDER — ONDANSETRON HCL 4 MG/2ML IJ SOLN
INTRAMUSCULAR | Status: DC | PRN
  Administered 2023-06-09: 4 mg via INTRAVENOUS

## 2023-06-09 MED ORDER — MENTHOL 3 MG MT LOZG: 1.0000 | LOZENGE | OROMUCOSAL | Status: DC | PRN

## 2023-06-09 MED ORDER — METOCLOPRAMIDE HCL 5 MG/ML IJ SOLN
5.0000 mg | Freq: Three times a day (TID) | INTRAMUSCULAR | Status: DC | PRN
  Administered 2023-06-09 – 2023-06-10 (×2): 10 mg via INTRAVENOUS
  Filled 2023-06-09 (×2): qty 2

## 2023-06-09 MED ORDER — METOCLOPRAMIDE HCL 5 MG PO TABS: 5.0000 mg | ORAL_TABLET | Freq: Three times a day (TID) | ORAL | Status: DC | PRN

## 2023-06-09 MED ORDER — BUPIVACAINE IN DEXTROSE 0.75-8.25 % IT SOLN
INTRATHECAL | Status: DC | PRN
  Administered 2023-06-09: 1.6 mL via INTRATHECAL

## 2023-06-09 MED ORDER — CEFAZOLIN SODIUM-DEXTROSE 1-4 GM/50ML-% IV SOLN
1.0000 g | Freq: Four times a day (QID) | INTRAVENOUS | Status: AC
  Administered 2023-06-09 – 2023-06-10 (×2): 1 g via INTRAVENOUS
  Filled 2023-06-09 (×2): qty 50

## 2023-06-09 SURGICAL SUPPLY — 43 items
APL SKNCLS STERI-STRIP NONHPOA (GAUZE/BANDAGES/DRESSINGS)
BAG COUNTER SPONGE SURGICOUNT (BAG) ×1 IMPLANT
BAG SPEC THK2 15X12 ZIP CLS (MISCELLANEOUS)
BAG SPNG CNTER NS LX DISP (BAG) ×1
BAG ZIPLOCK 12X15 (MISCELLANEOUS) IMPLANT
BALL HIP CERAMIC (Hips) IMPLANT
BENZOIN TINCTURE PRP APPL 2/3 (GAUZE/BANDAGES/DRESSINGS) IMPLANT
BLADE SAW SGTL 18X1.27X75 (BLADE) ×1 IMPLANT
COVER PERINEAL POST (MISCELLANEOUS) ×1 IMPLANT
COVER SURGICAL LIGHT HANDLE (MISCELLANEOUS) ×1 IMPLANT
CUP SECTOR GRIPTON 50MM (Cup) IMPLANT
DRAPE FOOT SWITCH (DRAPES) ×1 IMPLANT
DRAPE STERI IOBAN 125X83 (DRAPES) ×1 IMPLANT
DRAPE U-SHAPE 47X51 STRL (DRAPES) ×2 IMPLANT
DRSG AQUACEL AG ADV 3.5X10 (GAUZE/BANDAGES/DRESSINGS) ×1 IMPLANT
DURAPREP 26ML APPLICATOR (WOUND CARE) ×1 IMPLANT
ELECT REM PT RETURN 15FT ADLT (MISCELLANEOUS) ×1 IMPLANT
GAUZE XEROFORM 1X8 LF (GAUZE/BANDAGES/DRESSINGS) IMPLANT
GLOVE BIO SURGEON STRL SZ7.5 (GLOVE) ×1 IMPLANT
GLOVE BIOGEL PI IND STRL 8 (GLOVE) ×2 IMPLANT
GLOVE ECLIPSE 8.0 STRL XLNG CF (GLOVE) ×1 IMPLANT
GOWN STRL REUS W/ TWL XL LVL3 (GOWN DISPOSABLE) ×2 IMPLANT
GOWN STRL REUS W/TWL XL LVL3 (GOWN DISPOSABLE) ×2
HANDPIECE INTERPULSE COAX TIP (DISPOSABLE) ×1
HIP BALL CERAMIC (Hips) ×1 IMPLANT
HOLDER FOLEY CATH W/STRAP (MISCELLANEOUS) ×1 IMPLANT
KIT TURNOVER KIT A (KITS) IMPLANT
LINER ACET PNNCL PLUS4 NEUTRAL (Hips) IMPLANT
PACK ANTERIOR HIP CUSTOM (KITS) ×1 IMPLANT
PINNACLE PLUS 4 NEUTRAL (Hips) ×1 IMPLANT
SET HNDPC FAN SPRY TIP SCT (DISPOSABLE) ×1 IMPLANT
STAPLER VISISTAT 35W (STAPLE) IMPLANT
STEM FEM ACTIS HIGH SZ3 (Stem) IMPLANT
STRIP CLOSURE SKIN 1/2X4 (GAUZE/BANDAGES/DRESSINGS) IMPLANT
SUT ETHIBOND NAB CT1 #1 30IN (SUTURE) ×1 IMPLANT
SUT ETHILON 2 0 PS N (SUTURE) IMPLANT
SUT MNCRL AB 4-0 PS2 18 (SUTURE) IMPLANT
SUT VIC AB 0 CT1 36 (SUTURE) ×1 IMPLANT
SUT VIC AB 1 CT1 36 (SUTURE) ×1 IMPLANT
SUT VIC AB 2-0 CT1 27 (SUTURE) ×2
SUT VIC AB 2-0 CT1 TAPERPNT 27 (SUTURE) ×2 IMPLANT
TRAY FOLEY MTR SLVR 16FR STAT (SET/KITS/TRAYS/PACK) IMPLANT
YANKAUER SUCT BULB TIP NO VENT (SUCTIONS) ×1 IMPLANT

## 2023-06-09 NOTE — Discharge Instructions (Signed)
Information on my medicine - ELIQUIS (apixaban)  This medication education was reviewed with me or my healthcare representative as part of my discharge preparation.  The pharmacist that spoke with me during my hospital stay was:    Why was Eliquis prescribed for you? Eliquis was prescribed for you to reduce the risk of blood clots forming after orthopedic surgery.    What do You need to know about Eliquis? Take your Eliquis TWICE DAILY - one tablet in the morning and one tablet in the evening with or without food.  It would be best to take the dose about the same time each day.  If you have difficulty swallowing the tablet whole please discuss with your pharmacist how to take the medication safely.  Take Eliquis exactly as prescribed by your doctor and DO NOT stop taking Eliquis without talking to the doctor who prescribed the medication.  Stopping without other medication to take the place of Eliquis may increase your risk of developing a clot.  After discharge, you should have regular check-up appointments with your healthcare provider that is prescribing your Eliquis.  What do you do if you miss a dose? If a dose of ELIQUIS is not taken at the scheduled time, take it as soon as possible on the same day and twice-daily administration should be resumed.  The dose should not be doubled to make up for a missed dose.  Do not take more than one tablet of ELIQUIS at the same time.  Important Safety Information A possible side effect of Eliquis is bleeding. You should call your healthcare provider right away if you experience any of the following: Bleeding from an injury or your nose that does not stop. Unusual colored urine (red or dark brown) or unusual colored stools (red or black). Unusual bruising for unknown reasons. A serious fall or if you hit your head (even if there is no bleeding).  Some medicines may interact with Eliquis and might increase your risk of bleeding or  clotting while on Eliquis. To help avoid this, consult your healthcare provider or pharmacist prior to using any new prescription or non-prescription medications, including herbals, vitamins, non-steroidal anti-inflammatory drugs (NSAIDs) and supplements.  This website has more information on Eliquis (apixaban): http://[email or web address].com/eliquis/home      Dr. Brian Swinteck Joint Replacement Specialist [facility] [address] , [place] [address] [phone]   TOTAL HIP REPLACEMENT POSTOPERATIVE DIRECTIONS    Hip Rehabilitation, Guidelines Following Surgery   WEIGHT BEARING Weight bearing as tolerated with assist device (walker, cane, etc) as directed, use it as long as suggested by your surgeon or therapist, typically at least 4-6 weeks.  The results of a hip operation are greatly improved after range of motion and muscle strengthening exercises. Follow all safety measures which are given to protect your hip. If any of these exercises cause increased pain or swelling in your joint, decrease the amount until you are comfortable again. Then slowly increase the exercises. Call your caregiver if you have problems or questions.   HOME CARE INSTRUCTIONS  Most of the following instructions are designed to prevent the dislocation of your new hip.  Remove items at home which could result in a fall. This includes throw rugs or furniture in walking pathways.  Continue medications as instructed at time of discharge. You may have some home medications which will be placed on hold until you complete the course of blood thinner medication. You may start showering once you are discharged home.   Do not remove your dressing. Do not put on socks or shoes without following the instructions of your caregivers.   Sit on chairs with arms. Use the chair arms to help push yourself up when arising.  Arrange for the use of a toilet seat elevator so you are not sitting low.   Walk with walker as instructed.  You may resume a sexual relationship in one month or when given the OK by your caregiver.  Use walker as long as suggested by your caregivers.  You may put full weight on your legs and walk as much as is comfortable. Avoid periods of inactivity such as sitting longer than an hour when not asleep. This helps prevent blood clots.  You may return to work once you are cleared by your surgeon.  Do not drive a car for 6 weeks or until released by your surgeon.  Do not drive while taking narcotics.  Wear elastic stockings for two weeks following surgery during the day but you may remove then at night.  Make sure you keep all of your appointments after your operation with all of your doctors and caregivers. You should call the office at the above phone number and make an appointment for approximately two weeks after the date of your surgery. Please pick up a stool softener and laxative for home use as long as you are requiring pain medications. ICE to the affected hip every three hours for 30 minutes at a time and then as needed for pain and swelling. Continue to use ice on the hip for pain and swelling from surgery. You may notice swelling that will progress down to the foot and ankle.  This is normal after surgery.  Elevate the leg when you are not up walking on it.   It is important for you to complete the blood thinner medication as prescribed by your doctor. Continue to use the breathing machine which will help keep your temperature down.  It is common for your temperature to cycle up and down following surgery, especially at night when you are not up moving around and exerting yourself.  The breathing machine keeps your lungs expanded and your temperature down.  RANGE OF MOTION AND STRENGTHENING EXERCISES  These exercises are designed to help you keep full movement of your hip joint. Follow your caregiver's or physical therapist's instructions. Perform all exercises  about fifteen times, three times per day or as directed. Exercise both hips, even if you have had only one joint replacement. These exercises can be done on a training (exercise) mat, on the floor, on a table or on a bed. Use whatever works the best and is most comfortable for you. Use music or television while you are exercising so that the exercises are a pleasant break in your day. This will make your life better with the exercises acting as a break in routine you can look forward to.  Lying on your back, slowly slide your foot toward your buttocks, raising your knee up off the floor. Then slowly slide your foot back down until your leg is straight again.  Lying on your back spread your legs as far apart as you can without causing discomfort.  Lying on your side, raise your upper leg and foot straight up from the floor as far as is comfortable. Slowly lower the leg and repeat.  Lying on your back, tighten up the muscle in the front of your thigh (quadriceps muscles). You can do this by keeping your   leg straight and trying to raise your heel off the floor. This helps strengthen the largest muscle supporting your knee.  Lying on your back, tighten up the muscles of your buttocks both with the legs straight and with the knee bent at a comfortable angle while keeping your heel on the floor.   SKILLED REHAB INSTRUCTIONS: If the patient is transferred to a skilled rehab facility following release from the hospital, a list of the current medications will be sent to the facility for the patient to continue.  When discharged from the skilled rehab facility, please have the facility set up the patient's Home Health Physical Therapy prior to being released. Also, the skilled facility will be responsible for providing the patient with their medications at time of release from the facility to include their pain medication and their blood thinner medication. If the patient is still at the rehab facility at time of the  two week follow up appointment, the skilled rehab facility will also need to assist the patient in arranging follow up appointment in our office and any transportation needs.  POST-OPERATIVE OPIOID TAPER INSTRUCTIONS: It is important to wean off of your opioid medication as soon as possible. If you do not need pain medication after your surgery it is ok to stop day one. Opioids include: Codeine, Hydrocodone(Norco, Vicodin), Oxycodone(Percocet, oxycontin) and hydromorphone amongst others.  Long term and even short term use of opiods can cause: Increased pain response Dependence Constipation Depression Respiratory depression And more.  Withdrawal symptoms can include Flu like symptoms Nausea, vomiting And more Techniques to manage these symptoms Hydrate well Eat regular healthy meals Stay active Use relaxation techniques(deep breathing, meditating, yoga) Do Not substitute Alcohol to help with tapering If you have been on opioids for less than two weeks and do not have pain than it is ok to stop all together.  Plan to wean off of opioids This plan should start within one week post op of your joint replacement. Maintain the same interval or time between taking each dose and first decrease the dose.  Cut the total daily intake of opioids by one tablet each day Next start to increase the time between doses. The last dose that should be eliminated is the evening dose.    MAKE SURE YOU:  Understand these instructions.  Will watch your condition.  Will get help right away if you are not doing well or get worse.  Pick up stool softner and laxative for home use following surgery while on pain medications. Do not remove your dressing. The dressing is waterproof--it is OK to take showers. Continue to use ice for pain and swelling after surgery. Do not use any lotions or creams on the incision until instructed by your surgeon. Total Hip Protocol.  

## 2023-06-09 NOTE — Plan of Care (Signed)
Plan of care and goals reviewed with patient and husband, time given for questions and answers, patient handbook/guide ar bedside.  Problem: Education: Goal: Knowledge of the prescribed therapeutic regimen will improve Outcome: Progressing Goal: Understanding of discharge needs will improve Outcome: Progressing Goal: Individualized Educational Video(s) Outcome: Progressing   Problem: Activity: Goal: Ability to avoid complications of mobility impairment will improve Outcome: Progressing Goal: Ability to tolerate increased activity will improve Outcome: Progressing   Problem: Clinical Measurements: Goal: Postoperative complications will be avoided or minimized Outcome: Progressing   Problem: Pain Management: Goal: Pain level will decrease with appropriate interventions Outcome: Progressing   Problem: Skin Integrity: Goal: Will show signs of wound healing Outcome: Progressing   Problem: Education: Goal: Knowledge of General Education information will improve Description: Including pain rating scale, medication(s)/side effects and non-pharmacologic comfort measures Outcome: Progressing   Problem: Health Behavior/Discharge Planning: Goal: Ability to manage health-related needs will improve Outcome: Progressing   Problem: Clinical Measurements: Goal: Ability to maintain clinical measurements within normal limits will improve Outcome: Progressing Goal: Will remain free from infection Outcome: Progressing Goal: Diagnostic test results will improve Outcome: Progressing Goal: Respiratory complications will improve Outcome: Progressing Goal: Cardiovascular complication will be avoided Outcome: Progressing   Problem: Activity: Goal: Risk for activity intolerance will decrease Outcome: Progressing   Problem: Nutrition: Goal: Adequate nutrition will be maintained Outcome: Progressing   Problem: Coping: Goal: Level of anxiety will decrease Outcome: Progressing   Problem:  Elimination: Goal: Will not experience complications related to bowel motility Outcome: Progressing Goal: Will not experience complications related to urinary retention Outcome: Progressing   Problem: Pain Managment: Goal: General experience of comfort will improve Outcome: Progressing   Problem: Safety: Goal: Ability to remain free from injury will improve Outcome: Progressing   Problem: Skin Integrity: Goal: Risk for impaired skin integrity will decrease Outcome: Progressing

## 2023-06-09 NOTE — Anesthesia Preprocedure Evaluation (Signed)
Anesthesia Evaluation  Patient identified by MRN, date of birth, ID band Patient awake    Reviewed: Allergy & Precautions, NPO status , Patient's Chart, lab work & pertinent test results  History of Anesthesia Complications (+) PONV and history of anesthetic complications  Airway Mallampati: III  TM Distance: >3 FB Neck ROM: Full    Dental  (+) Teeth Intact, Dental Advisory Given, Implants   Pulmonary neg shortness of breath, asthma , neg sleep apnea, neg COPD, neg recent URI   breath sounds clear to auscultation       Cardiovascular negative cardio ROS  Rhythm:Regular     Neuro/Psych  PSYCHIATRIC DISORDERS Anxiety      Neuromuscular disease    GI/Hepatic Neg liver ROS,GERD  ,,  Endo/Other  negative endocrine ROS    Renal/GU negative Renal ROS     Musculoskeletal  (+) Arthritis ,  Fibromyalgia -  Abdominal   Peds  Hematology Lab Results      Component                Value               Date                      WBC                      8.7                 05/30/2023                HGB                      14.3                05/30/2023                HCT                      44.0                05/30/2023                MCV                      93.6                05/30/2023                PLT                      339                 05/30/2023            Denies blood thinners   Anesthesia Other Findings   Reproductive/Obstetrics                             Anesthesia Physical Anesthesia Plan  ASA: 2  Anesthesia Plan: MAC and Spinal   Post-op Pain Management:    Induction: Intravenous  PONV Risk Score and Plan: 3 and Propofol infusion, Treatment may vary due to age or medical condition and Ondansetron  Airway Management Planned: Nasal Cannula, Natural Airway and Simple Face Mask  Additional Equipment: None  Intra-op Plan:   Post-operative Plan:   Informed Consent: I  have  reviewed the patients History and Physical, chart, labs and discussed the procedure including the risks, benefits and alternatives for the proposed anesthesia with the patient or authorized representative who has indicated his/her understanding and acceptance.     Dental advisory given  Plan Discussed with: CRNA  Anesthesia Plan Comments:        Anesthesia Quick Evaluation

## 2023-06-09 NOTE — Anesthesia Procedure Notes (Signed)
Spinal  Patient location during procedure: OR Start time: 06/09/2023 1:06 PM End time: 06/09/2023 1:18 PM Reason for block: surgical anesthesia Staffing Performed: anesthesiologist  Anesthesiologist: Val Eagle, MD Performed by: Val Eagle, MD Authorized by: Val Eagle, MD   Preanesthetic Checklist Completed: patient identified, IV checked, risks and benefits discussed, surgical consent, monitors and equipment checked, pre-op evaluation and timeout performed Spinal Block Patient position: sitting Prep: DuraPrep Patient monitoring: heart rate, cardiac monitor, continuous pulse ox and blood pressure Approach: midline Location: L3-4 Injection technique: single-shot Needle Needle type: Pencan  Needle gauge: 24 G Needle length: 9 cm Assessment Sensory level: T6 Events: CSF return and second provider Additional Notes Attempted at L5/S1 without ability to gain midline access. Moved up to L3/4 and increased lumbar curvature with success

## 2023-06-09 NOTE — Interval H&P Note (Signed)
History and Physical Interval Note: The patient understands that she is here today for a right total hip replacement to treat her severe right hip arthritis.  There has been no acute or interval change in her medical status.  The risks and benefits of surgery have been discussed in detail and informed consent has been obtained.  The right operative hip has been marked.  06/09/2023 11:24 AM  Lindsay Watts  has presented today for surgery, with the diagnosis of osteoarthritis right hip.  The various methods of treatment have been discussed with the patient and family. After consideration of risks, benefits and other options for treatment, the patient has consented to  Procedure(s): RIGHT TOTAL HIP ARTHROPLASTY ANTERIOR APPROACH (Right) as a surgical intervention.  The patient's history has been reviewed, patient examined, no change in status, stable for surgery.  I have reviewed the patient's chart and labs.  Questions were answered to the patient's satisfaction.     Kathryne Hitch

## 2023-06-09 NOTE — Op Note (Signed)
Operative Note  Date of operation: 06/09/2023 Preoperative diagnosis: Right hip primary osteoarthritis Postoperative diagnosis: Same  Procedure: Right direct anterior total hip arthroplasty  Implants: Implant Name Type Inv. Item Serial No. Manufacturer Lot No. LRB No. Used Action  PINNACLE PLUS 4 NEUTRAL - [id number] Hips PINNACLE PLUS 4 NEUTRAL  [employer] [id number] Right 1 Implanted  CUP SECTOR GRIPTON - [id number] Cup CUP SECTOR GRIPTON  DEPU[employer]TH[employer] [id number] Right 1 Implanted  STEM FEM ACTIS HIGH SZ3 - [id number] Stem STEM FEM ACTIS HIGH SZ3  DEPU[employer]TH[employer] [id number] Right 1 Implanted  HIP BALL CERAMIC - [id number] Hips HIP BALL CERAMIC  DEPU[employer]TH[employer] [id number] Right 1 Implanted   Surgeon: Vanita Panda. Magnus Ivan, MD Assistant: Rexene Edison, PA-C  Anesthesia #1 spinal #2 General Via LMA EBL: 200 cc Antibiotics: 2 g IV Ancef Complications: None  Indications: The patient is a 67 year old female well-known to me.  She has debilitating arthritis involving her right hip this been well-documented.  X-rays show bone-on-bone wear of that hip and from a clinical exam standpoint she has severe groin pain and limited motion of that hip.  With time she has tried and failed conservative treatment.  At this point her hip pain is daily with her right hip and it is detrimentally affecting her mobility, her quality of life and her activities of daily living to the point she wishes to proceed with a total hip arthroplasty on the right side and we agree with this as well.  We did discuss in length in detail the risks of acute blood loss anemia, nerve or vessel injury, fracture, infection, DVT, dislocation, implant failure, leg length use.  She understands her goals are hopefully decrease pain, improve mobility, and improve quality of life.  Procedure description: After informed consent was obtained and the appropriate right hip was marked, the patient was brought to the operating  room and set up on the stretcher where spinal anesthesia was obtained.  She was then laid in supine position on the stretcher and a Foley catheter was placed.  Traction boots were placed on both her feet and she was placed supine on the Hana fracture table with a perineal post in place in both legs and inline skeletal traction device flat.  Her right operative hip and pelvis were assessed radiographically.  The right hip was prepped and draped with DuraPrep and sterile drapes.  A timeout was called and she was notified is correct patient the correct right hip.  An incision was then made just inferior and posterior to the ASIS and carried slightly obliquely down the leg.  Dissection was carried down to the tensor fascia lata muscle and the tensor fascia was then divided longitudinally to proceed with a direct interposed the hip.  Circumflex vessels were identified and cauterized.  The hip capsule was identified in L-type format finding a moderate joint effusion.  Cobra retractors were placed around the medial and lateral femoral neck and a femoral neck cut was made with an oscillating saw just proximal to the lesser trochanter and the femoral neck cut was completed with an osteotome.  A corkscrew guide was placed in the femoral head and the femoral head was removed in its entirety and there was a wide area devoid of cartilage.  A bent Hohmann was then placed over the medial acetabular rim and remnants of the acetabular labrum and other debris removed.  Reaming was then initiated using the DePuy reaming system from a size 43 reamer and stepwise increments  going up to a size 49 reamer with all reamers placed under direct visualization and the last reamer was also placed under direct fluoroscopy in order to obtain the depth of reaming, the anteversion and inclination.  The real DePuy sector GRIPTION acetabular clinic size 50 was then placed without difficulty followed by a 32+4 polyethylene liner.  Attention was then  turned to the femur.  With the right leg externally rotated to 140 degrees, extended and adducted, a Mueller retractor was placed medially and a Hohmann retractor was placed behind the greater trochanter.  The lateral joint capsule was released and a box cutting osteotome was used in her femoral canal.  Broaching was then initiated using the Actis broaching system from a size 0 going to a size 3.  With a size 3 in place we trialed a standard offset femoral neck and a 32+1 trial hip ball.  The right leg was brought over and up and with traction and internal rotation which is in the pelvis.  Based on clinical and radiographic assessment we felt like we needed more leg length.  The hip was dislocated and we removed the trial components.  We placed the real Actis femoral component size 3 with high offset followed by placing a 32+5 ceramic hip ball.  This again was reduced and acetabulum and we are pleased with leg length, range of motion, offset and stability assessed again clinically and radiographically.  The soft tissue was then irrigated with normal saline solution.  The joint capsule remnants were closed with interrupted #1 Ethibond suture followed by #1 Vicryl to close the tensor fascia.  0 Vicryl was used to close the deep tissue and 2-0 Vicryl was used to close subcutaneous tissue.  The skin was closed with staples.  An Aquacel dressing was applied.  The patient was taken to the recovery room in stable condition.  Go-cart PA-C did assist during the entire case and beginning to end and his assistance was clinical and medically necessary for soft tissue management and retraction, helping guide implant placement and a layered closure of the wound.

## 2023-06-09 NOTE — Transfer of Care (Signed)
Immediate Anesthesia Transfer of Care Note  Patient: AHNYLAH GLIDEWELL  Procedure(s) Performed: RIGHT TOTAL HIP ARTHROPLASTY ANTERIOR APPROACH (Right: Hip)  Patient Location: PACU  Anesthesia Type:General and Spinal  Level of Consciousness: awake, alert , and oriented  Airway & Oxygen Therapy: Patient Spontanous Breathing and Patient connected to face mask oxygen  Post-op Assessment: Report given to RN and Post -op Vital signs reviewed and stable  Post vital signs: Reviewed and stable  Last Vitals:  Vitals Value Taken Time  BP 129/83 06/09/23 1443  Temp 36.4 C 06/09/23 1443  Pulse 93 06/09/23 1445  Resp 14 06/09/23 1445  SpO2 100 % 06/09/23 1445  Vitals shown include unfiled device data.  Last Pain:  Vitals:   06/09/23 1016  TempSrc:   PainSc: 5       Patients Stated Pain Goal: 5 (06/09/23 1016)  Complications: No notable events documented.

## 2023-06-09 NOTE — Anesthesia Procedure Notes (Signed)
Procedure Name: LMA Insertion Date/Time: 06/09/2023 1:43 PM  Performed by: Sourish Allender D, CRNAPre-anesthesia Checklist: Patient identified, Emergency Drugs available, Suction available and Patient being monitored Patient Re-evaluated:Patient Re-evaluated prior to induction Oxygen Delivery Method: Circle system utilized Preoxygenation: Pre-oxygenation with 100% oxygen Induction Type: IV induction Ventilation: Mask ventilation without difficulty LMA: LMA inserted LMA Size: 4.0 Tube type: Oral Number of attempts: 1 Placement Confirmation: positive ETCO2 and breath sounds checked- equal and bilateral Tube secured with: Tape Dental Injury: Teeth and Oropharynx as per pre-operative assessment

## 2023-06-10 ENCOUNTER — Other Ambulatory Visit: Payer: Self-pay

## 2023-06-10 DIAGNOSIS — Z79899 Other long term (current) drug therapy: Secondary | ICD-10-CM | POA: Diagnosis not present

## 2023-06-10 DIAGNOSIS — I951 Orthostatic hypotension: Secondary | ICD-10-CM | POA: Diagnosis present

## 2023-06-10 DIAGNOSIS — Z91040 Latex allergy status: Secondary | ICD-10-CM | POA: Diagnosis not present

## 2023-06-10 DIAGNOSIS — M1611 Unilateral primary osteoarthritis, right hip: Secondary | ICD-10-CM | POA: Diagnosis present

## 2023-06-10 DIAGNOSIS — Z886 Allergy status to analgesic agent status: Secondary | ICD-10-CM | POA: Diagnosis not present

## 2023-06-10 DIAGNOSIS — F419 Anxiety disorder, unspecified: Secondary | ICD-10-CM | POA: Diagnosis present

## 2023-06-10 DIAGNOSIS — M797 Fibromyalgia: Secondary | ICD-10-CM | POA: Diagnosis present

## 2023-06-10 LAB — BASIC METABOLIC PANEL
Anion gap: 10 (ref 5–15)
BUN: 10 mg/dL (ref 8–23)
CO2: 22 mmol/L (ref 22–32)
Calcium: 8.7 mg/dL — ABNORMAL LOW (ref 8.9–10.3)
Chloride: 103 mmol/L (ref 98–111)
Creatinine, Ser: 0.49 mg/dL (ref 0.44–1.00)
GFR, Estimated: >60 mL/min (ref >60)
Glucose, Bld: 135 mg/dL — ABNORMAL HIGH (ref 70–99)
Potassium: 4.9 mmol/L (ref 3.5–5.1)
Sodium: 135 mmol/L (ref 135–145)

## 2023-06-10 LAB — CBC
HCT: 43 % (ref 36.0–46.0)
Hemoglobin: 13.6 g/dL (ref 12.0–15.0)
MCH: 31.1 pg (ref 26.0–34.0)
MCHC: 31.6 g/dL (ref 30.0–36.0)
MCV: 98.2 fL (ref 80.0–100.0)
Platelets: 280 10*3/uL (ref 150–400)
RBC: 4.38 MIL/uL (ref 3.87–5.11)
RDW: 11.7 % (ref 11.5–15.5)
WBC: 13.2 10*3/uL — ABNORMAL HIGH (ref 4.0–10.5)
nRBC: 0 % (ref 0.0–0.2)

## 2023-06-10 MED ORDER — METHOCARBAMOL 500 MG PO TABS: 500.0000 mg | ORAL_TABLET | Freq: Four times a day (QID) | ORAL | 1 refills | Status: AC | PRN

## 2023-06-10 MED ORDER — ONDANSETRON 4 MG PO TBDP: 4.0000 mg | ORAL_TABLET | Freq: Three times a day (TID) | ORAL | 0 refills | Status: AC | PRN

## 2023-06-10 MED ORDER — APIXABAN 2.5 MG PO TABS: 2.5000 mg | ORAL_TABLET | Freq: Two times a day (BID) | ORAL | 0 refills | Status: AC

## 2023-06-10 MED ORDER — OXYCODONE HCL 5 MG PO TABS: 5.0000 mg | ORAL_TABLET | Freq: Four times a day (QID) | ORAL | 0 refills | Status: AC | PRN

## 2023-06-10 NOTE — Progress Notes (Signed)
Subjective: 1 Day Post-Op Procedure(s) (LRB): RIGHT TOTAL HIP ARTHROPLASTY ANTERIOR APPROACH (Right) Patient reports pain as moderate.  Nausea and anxiety as well, but motivated.   Objective: Vital signs in last 24 hours: Temp:  [97.5 F (36.4 C)-99.4 F (37.4 C)] 98.3 F (36.8 C) (08/31 1017) Pulse Rate:  [74-95] 85 (08/31 1017) Resp:  [10-20] 17 (08/31 1017) BP: (103-139)/(65-93) 130/77 (08/31 1017) SpO2:  [94 %-100 %] 98 % (08/31 1017) Weight:  [75 kg] 75 kg (08/30 1930)  Intake/Output from previous day: 08/30 0701 - 08/31 0700 In: 2774.7 [P.O.:200; I.V.:2374.7; IV Piggyback:200] Out: 1325 [Urine:1125; Blood:200] Intake/Output this shift: No intake/output data recorded.  Recent Labs    06/10/23 0330  HGB 13.6   Recent Labs    06/10/23 0330  WBC 13.2*  RBC 4.38  HCT 43.0  PLT 280   Recent Labs    06/10/23 0330  NA 135  K 4.9  CL 103  CO2 22  BUN 10  CREATININE 0.49  GLUCOSE 135*  CALCIUM 8.7*   No results for input(s): "LABPT", "INR" in the last 72 hours.  Sensation intact distally Intact pulses distally Dorsiflexion/Plantar flexion intact Incision: dressing C/D/I   Assessment/Plan: 1 Day Post-Op Procedure(s) (LRB): RIGHT TOTAL HIP ARTHROPLASTY ANTERIOR APPROACH (Right) Up with therapy Plan for discharge tomorrow Discharge home with home health      Kathryne Hitch 06/10/2023, 11:18 AM

## 2023-06-10 NOTE — Progress Notes (Signed)
Physical Therapy Treatment Patient Details Name: Lindsay Watts MRN: [id number] DOB: [birth year] Today's Date: 06/10/2023   History of Present Illness Pt is a 67 year old female s/p Rt THA on 06/09/23.    PT Comments  Pt premedicated for session with one pill and then took second pain pill just prior to mobilizing.  Pt reports no further nausea but limited by hip pain this afternoon as well. Pt only able to ambulate a few feet in room and then assisted back to bed per request.     If plan is discharge home, recommend the following: A little help with walking and/or transfers;A little help with bathing/dressing/bathroom;Assistance with cooking/housework;Help with stairs or ramp for entrance   Can travel by private vehicle        Equipment Recommendations  Rolling walker (2 wheels)    Recommendations for Other Services       Precautions / Restrictions Precautions Precautions: Fall Restrictions Weight Bearing Restrictions: No RLE Weight Bearing: Weight bearing as tolerated     Mobility  Bed Mobility Overal bed mobility: Needs Assistance Bed Mobility: Sit to Supine     Supine to sit: Min assist, Used rails, HOB elevated Sit to supine: Mod assist   General bed mobility comments: assist for lower bed for pain control    Transfers Overall transfer level: Needs assistance Equipment used: Rolling walker (2 wheels) Transfers: Sit to/from Stand Sit to Stand: Min assist Stand pivot transfers: Min assist         General transfer comment: verbal cues for UE and LE positioning, assist to rise and steady as well as control descent    Ambulation/Gait Ambulation/Gait assistance: Min assist, Contact guard assist Gait Distance (Feet): 8 Feet Assistive device: Rolling walker (2 wheels) Gait Pattern/deviations: Step-to pattern, Decreased stance time - right, Antalgic Gait velocity: decr     General Gait Details: pt reports increased pain with attempted movement of Rt LE and  weight bearing, encouraged pain control with more weight through UEs on RW, pt with difficulty with hip flexion so inching foot along floor and leading with Lt LE to assist with advancing Rt LE, slow and effortful movements   Stairs             Wheelchair Mobility     Tilt Bed    Modified Rankin (Stroke Patients Only)       Balance                                            Cognition Arousal: Alert Behavior During Therapy: WFL for tasks assessed/performed, Anxious Overall Cognitive Status: Within Functional Limits for tasks assessed                                          Exercises      General Comments        Pertinent Vitals/Pain Pain Assessment Pain Assessment: 0-10 Pain Score: 10-Worst pain ever Pain Location: Rt hip Pain Descriptors / Indicators: Guarding, Grimacing, Moaning Pain Intervention(s): Premedicated before session, Repositioned, Monitored during session    Home Living Family/patient expects to be discharged to:: Private residence Living Arrangements: Spouse/significant other   Type of Home: House Home Access: Stairs to enter Entrance Stairs-Rails: None Entrance Stairs-Number of Steps: 2   Home Layout:  One level Home Equipment: None      Prior Function            PT Goals (current goals can now be found in the care plan section) Acute Rehab PT Goals PT Goal Formulation: With patient/family Time For Goal Achievement: 06/17/23 Potential to Achieve Goals: Good Progress towards PT goals: Progressing toward goals    Frequency    7X/week      PT Plan      Co-evaluation              AM-PAC PT "6 Clicks" Mobility   Outcome Measure  Help needed turning from your back to your side while in a flat bed without using bedrails?: A Little Help needed moving from lying on your back to sitting on the side of a flat bed without using bedrails?: A Little Help needed moving to and from a bed to  a chair (including a wheelchair)?: A Little Help needed standing up from a chair using your arms (e.g., wheelchair or bedside chair)?: A Little Help needed to walk in hospital room?: A Lot Help needed climbing 3-5 steps with a railing? : A Lot 6 Click Score: 16    End of Session Equipment Utilized During Treatment: Gait belt Activity Tolerance: Patient limited by pain Patient left: in bed;with call bell/phone within reach;with bed alarm set;with family/visitor present Nurse Communication: Mobility status PT Visit Diagnosis: Difficulty in walking, not elsewhere classified (R26.2);Pain Pain - Right/Left: Right Pain - part of body: Hip     Time: 2130-8657 PT Time Calculation (min) (ACUTE ONLY): 12 min  Charges:    $Gait Training: 8-22 mins PT General Charges $$ ACUTE PT VISIT: 1 Visit                     Paulino Door, DPT Physical Therapist Acute Rehabilitation Services Office: [phone]    Janan Halter Payson 06/10/2023, 3:37 PM

## 2023-06-10 NOTE — Plan of Care (Signed)

## 2023-06-10 NOTE — Evaluation (Signed)
Physical Therapy Evaluation Patient Details Name: Lindsay Watts MRN: [id number] DOB: [birth year] Today's Date: 06/10/2023  History of Present Illness  Pt is a 67 year old female s/p Rt THA on 06/09/23.  Clinical Impression  Pt is s/p THA resulting in the deficits listed below (see PT Problem List).  Pt will benefit from acute skilled PT to increase their independence and safety with mobility to facilitate discharge.  Pt with increased nausea and pain this morning, so limited mobility to OOB to recliner.  Pt plans to return home with spouse and has a few steps to enter home upon d/c.      If plan is discharge home, recommend the following: A little help with walking and/or transfers;A little help with bathing/dressing/bathroom;Assistance with cooking/housework;Help with stairs or ramp for entrance   Can travel by private vehicle        Equipment Recommendations Rolling walker (2 wheels)  Recommendations for Other Services       Functional Status Assessment Patient has had a recent decline in their functional status and demonstrates the ability to make significant improvements in function in a reasonable and predictable amount of time.     Precautions / Restrictions Precautions Precautions: Fall Restrictions Weight Bearing Restrictions: No RLE Weight Bearing: Weight bearing as tolerated      Mobility  Bed Mobility Overal bed mobility: Needs Assistance Bed Mobility: Supine to Sit     Supine to sit: Min assist, Used rails, HOB elevated     General bed mobility comments: assist for Rt LE movement and support, cues for self assist, pt crying and yelling out in pain    Transfers Overall transfer level: Needs assistance Equipment used: Rolling walker (2 wheels) Transfers: Sit to/from Stand, Bed to chair/wheelchair/BSC Sit to Stand: Min assist Stand pivot transfers: Min assist         General transfer comment: verbal cues for UE and LE positioning, assist to rise and steady  as well as control descent, limited to transfer only due to pain and nausea    Ambulation/Gait                  Stairs            Wheelchair Mobility     Tilt Bed    Modified Rankin (Stroke Patients Only)       Balance                                             Pertinent Vitals/Pain Pain Assessment Pain Assessment: 0-10 Pain Score: 10-Worst pain ever Pain Location: Rt hip Pain Descriptors / Indicators: Crying, Guarding, Grimacing, Moaning Pain Intervention(s): Repositioned, Monitored during session    Home Living Family/patient expects to be discharged to:: Private residence Living Arrangements: Spouse/significant other   Type of Home: House Home Access: Stairs to enter Entrance Stairs-Rails: None Secretary/administrator of Steps: 2   Home Layout: One level Home Equipment: None      Prior Function Prior Level of Function : Independent/Modified Independent                     Extremity/Trunk Assessment        Lower Extremity Assessment Lower Extremity Assessment: RLE deficits/detail RLE Deficits / Details: requiring assist for Rt LE, extremely painful with any movement RLE: Unable to fully assess due to pain  Communication   Communication Communication: No apparent difficulties  Cognition Arousal: Alert Behavior During Therapy: WFL for tasks assessed/performed, Anxious Overall Cognitive Status: Within Functional Limits for tasks assessed                                          General Comments      Exercises     Assessment/Plan    PT Assessment Patient needs continued PT services  PT Problem List Decreased range of motion;Decreased strength;Pain;Decreased activity tolerance;Decreased mobility;Decreased balance;Decreased knowledge of use of DME       PT Treatment Interventions Stair training;Gait training;DME instruction;Balance training;Functional mobility training;Therapeutic  exercise;Therapeutic activities;Patient/family education    PT Goals (Current goals can be found in the Care Plan section)  Acute Rehab PT Goals PT Goal Formulation: With patient/family Time For Goal Achievement: 06/17/23 Potential to Achieve Goals: Good    Frequency 7X/week     Co-evaluation               AM-PAC PT "6 Clicks" Mobility  Outcome Measure Help needed turning from your back to your side while in a flat bed without using bedrails?: A Little Help needed moving from lying on your back to sitting on the side of a flat bed without using bedrails?: A Little Help needed moving to and from a bed to a chair (including a wheelchair)?: A Lot Help needed standing up from a chair using your arms (e.g., wheelchair or bedside chair)?: A Lot Help needed to walk in hospital room?: A Lot Help needed climbing 3-5 steps with a railing? : A Lot 6 Click Score: 14    End of Session Equipment Utilized During Treatment: Gait belt Activity Tolerance: Patient limited by pain Patient left: in chair;with call bell/phone within reach;with chair alarm set;with family/visitor present Nurse Communication: Mobility status PT Visit Diagnosis: Difficulty in walking, not elsewhere classified (R26.2);Pain Pain - Right/Left: Right Pain - part of body: Hip    Time: 2536-6440 PT Time Calculation (min) (ACUTE ONLY): 19 min   Charges:   PT Evaluation $PT Eval Low Complexity: 1 Low   PT General Charges $$ ACUTE PT VISIT: 1 Visit        Thomasene Mohair PT, DPT Physical Therapist Acute Rehabilitation Services Office: [phone]   Janan Halter Payson 06/10/2023, 12:33 PM

## 2023-06-11 MED ORDER — SODIUM CHLORIDE 0.9 % IV BOLUS
500.0000 mL | Freq: Once | INTRAVENOUS | Status: AC
  Administered 2023-06-11: 500 mL via INTRAVENOUS

## 2023-06-11 NOTE — Progress Notes (Signed)
Physical Therapy Treatment Patient Details Name: Lindsay Watts MRN: [id number] DOB: 28-May-1956 Today's Date: 06/11/2023   History of Present Illness Pt is a 67 year old female s/p Rt THA on 06/09/23.    PT Comments  Pt ambulated however distance limited by right hip pain despite premedication.  Pt also assisted with performing a few exercises in recliner.       If plan is discharge home, recommend the following: A little help with walking and/or transfers;A little help with bathing/dressing/bathroom;Assistance with cooking/housework;Help with stairs or ramp for entrance   Can travel by private vehicle        Equipment Recommendations  Rolling walker (2 wheels)    Recommendations for Other Services       Precautions / Restrictions Precautions Precautions: Fall Restrictions RLE Weight Bearing: Weight bearing as tolerated     Mobility  Bed Mobility               General bed mobility comments: sitting EOB on arrival, just used [facility] with nurse tech    Transfers Overall transfer level: Needs assistance Equipment used: Rolling walker (2 wheels) Transfers: Sit to/from Stand Sit to Stand: Contact guard assist           General transfer comment: verbal cues for UE and LE positioning    Ambulation/Gait Ambulation/Gait assistance: Contact guard assist Gait Distance (Feet): 10 Feet Assistive device: Rolling walker (2 wheels) Gait Pattern/deviations: Step-to pattern, Decreased stance time - right, Antalgic Gait velocity: decr     General Gait Details: encouraged pain control with more weight through UEs on RW which causes UEs to fatigue quickly; pt with difficulty with hip flexion so inching foot along floor and leading with Lt LE to assist with advancing Rt LE, slow and effortful movements   Stairs             Wheelchair Mobility     Tilt Bed    Modified Rankin (Stroke Patients Only)       Balance                                             Cognition Arousal: Alert Behavior During Therapy: WFL for tasks assessed/performed, Anxious Overall Cognitive Status: Within Functional Limits for tasks assessed                                          Exercises Total Joint Exercises Ankle Circles/Pumps: AROM, Both, 10 reps Quad Sets: AROM, Both, 10 reps Heel Slides: AAROM, Right, 10 reps Hip ABduction/ADduction: AAROM, Right, 10 reps    General Comments        Pertinent Vitals/Pain Pain Assessment Pain Assessment: 0-10 Pain Score: 9  Pain Location: Rt hip Pain Descriptors / Indicators: Guarding, Grimacing Pain Intervention(s): Repositioned, Premedicated before session, Monitored during session    Home Living                          Prior Function            PT Goals (current goals can now be found in the care plan section) Progress towards PT goals: Progressing toward goals    Frequency    7X/week      PT Plan  Co-evaluation              AM-PAC PT "6 Clicks" Mobility   Outcome Measure  Help needed turning from your back to your side while in a flat bed without using bedrails?: A Little Help needed moving from lying on your back to sitting on the side of a flat bed without using bedrails?: A Little Help needed moving to and from a bed to a chair (including a wheelchair)?: A Little Help needed standing up from a chair using your arms (e.g., wheelchair or bedside chair)?: A Little Help needed to walk in hospital room?: A Little Help needed climbing 3-5 steps with a railing? : A Lot 6 Click Score: 17    End of Session Equipment Utilized During Treatment: Gait belt Activity Tolerance: Patient limited by pain Patient left: in chair;with call bell/phone within reach Nurse Communication: Mobility status PT Visit Diagnosis: Difficulty in walking, not elsewhere classified (R26.2);Pain Pain - Right/Left: Right Pain - part of body: Hip     Time:  2956-2130 PT Time Calculation (min) (ACUTE ONLY): 19 min  Charges:    $Gait Training: 8-22 mins PT General Charges $$ ACUTE PT VISIT: 1 Visit          Paulino Door, DPT Physical Therapist Acute Rehabilitation Services Office: [phone]    Kati L Payson 06/11/2023, 3:09 PM

## 2023-06-11 NOTE — Progress Notes (Signed)
Physical Therapy Treatment Patient Details Name: Lindsay Watts MRN: [id number] DOB: October 30, 1955 Today's Date: 06/11/2023   History of Present Illness Pt is a 67 year old female s/p Rt THA on 06/09/23.    PT Comments  Pt assisted with ambulating approx 25 ft into hallway.  Pt encouraged to improve distance as able.  Pt continues to report significant right hip pain and also a little nauseated upon returning to bed this afternoon.  Pt not able to tolerate performing stair education/training at this time.     If plan is discharge home, recommend the following: A little help with walking and/or transfers;A little help with bathing/dressing/bathroom;Assistance with cooking/housework;Help with stairs or ramp for entrance   Can travel by private vehicle        Equipment Recommendations  Rolling walker (2 wheels)    Recommendations for Other Services       Precautions / Restrictions Precautions Precautions: Fall Restrictions RLE Weight Bearing: Weight bearing as tolerated     Mobility  Bed Mobility Overal bed mobility: Needs Assistance Bed Mobility: Sit to Supine       Sit to supine: Min assist   General bed mobility comments: assist for Rt LE onto bed    Transfers Overall transfer level: Needs assistance Equipment used: Rolling walker (2 wheels) Transfers: Sit to/from Stand Sit to Stand: Contact guard assist           General transfer comment: verbal cues for UE and LE positioning    Ambulation/Gait Ambulation/Gait assistance: Contact guard assist Gait Distance (Feet): 25 Feet Assistive device: Rolling walker (2 wheels) Gait Pattern/deviations: Step-to pattern, Decreased stance time - right, Antalgic Gait velocity: decr     General Gait Details: encouraged pain control with more weight through UEs on RW which causes UEs to fatigue quickly; pt with difficulty with hip flexion so inching foot along floor and/or leading with Lt LE to assist with advancing Rt LE, slow  and effortful movements, encouraged increasing distance as able   Stairs             Wheelchair Mobility     Tilt Bed    Modified Rankin (Stroke Patients Only)       Balance                                            Cognition Arousal: Alert Behavior During Therapy: WFL for tasks assessed/performed, Anxious Overall Cognitive Status: Within Functional Limits for tasks assessed                                          Exercises     General Comments        Pertinent Vitals/Pain Pain Assessment Pain Assessment: 0-10 Pain Score: 9  Pain Location: Rt hip Pain Descriptors / Indicators: Guarding, Grimacing Pain Intervention(s): Repositioned, Monitored during session, Premedicated before session    Home Living                          Prior Function            PT Goals (current goals can now be found in the care plan section) Progress towards PT goals: Progressing toward goals    Frequency    7X/week  PT Plan      Co-evaluation              AM-PAC PT "6 Clicks" Mobility   Outcome Measure  Help needed turning from your back to your side while in a flat bed without using bedrails?: A Little Help needed moving from lying on your back to sitting on the side of a flat bed without using bedrails?: A Little Help needed moving to and from a bed to a chair (including a wheelchair)?: A Little Help needed standing up from a chair using your arms (e.g., wheelchair or bedside chair)?: A Little Help needed to walk in hospital room?: A Little Help needed climbing 3-5 steps with a railing? : A Lot 6 Click Score: 17    End of Session Equipment Utilized During Treatment: Gait belt Activity Tolerance: Patient limited by pain Patient left: in bed;with call bell/phone within reach;with family/visitor present;with nursing/sitter in room Nurse Communication: Mobility status PT Visit Diagnosis: Difficulty in  walking, not elsewhere classified (R26.2);Pain Pain - Right/Left: Right Pain - part of body: Hip     Time: 2952-8413 PT Time Calculation (min) (ACUTE ONLY): 23 min  Charges:    $Gait Training: 23-37 mins PT General Charges $$ ACUTE PT VISIT: 1 Visit                     Paulino Door, DPT Physical Therapist Acute Rehabilitation Services Office: [phone]   Kati L Payson 06/11/2023, 3:12 PM

## 2023-06-11 NOTE — Anesthesia Postprocedure Evaluation (Signed)
Anesthesia Post Note  Patient: Lindsay Watts  Procedure(s) Performed: RIGHT TOTAL HIP ARTHROPLASTY ANTERIOR APPROACH (Right: Hip)     Patient location during evaluation: PACU Anesthesia Type: MAC, Spinal and General Level of consciousness: awake and alert Pain management: pain level controlled Vital Signs Assessment: post-procedure vital signs reviewed and stable Respiratory status: spontaneous breathing, nonlabored ventilation, respiratory function stable and patient connected to nasal cannula oxygen Cardiovascular status: blood pressure returned to baseline and stable Postop Assessment: no apparent nausea or vomiting Anesthetic complications: no   No notable events documented.  Last Vitals:  Vitals:   06/10/23 2114 06/11/23 0550  BP: 104/64 100/71  Pulse: 89 84  Resp: 17 18  Temp: 37.1 C 36.9 C  SpO2: 96% 94%    Last Pain:  Vitals:   06/11/23 0550  TempSrc: Oral  PainSc:                  [name]

## 2023-06-11 NOTE — Progress Notes (Addendum)
Subjective: 2 Days Post-Op Procedure(s) (LRB): RIGHT TOTAL HIP ARTHROPLASTY ANTERIOR APPROACH (Right) Patient reports pain as moderate.    Objective: Vital signs in last 24 hours: Temp:  [98.3 F (36.8 C)-98.9 F (37.2 C)] 98.5 F (36.9 C) (09/01 0550) Pulse Rate:  [82-89] 84 (09/01 0550) Resp:  [17-18] 18 (09/01 0550) BP: (100-130)/(63-77) 100/71 (09/01 0550) SpO2:  [94 %-99 %] 94 % (09/01 0550)  Intake/Output from previous day: 08/31 0701 - 09/01 0700 In: 833.3 [P.O.:240; I.V.:593.3] Out: 850 [Urine:850] Intake/Output this shift: No intake/output data recorded.  Recent Labs    06/10/23 0330  HGB 13.6   Recent Labs    06/10/23 0330  WBC 13.2*  RBC 4.38  HCT 43.0  PLT 280   Recent Labs    06/10/23 0330  NA 135  K 4.9  CL 103  CO2 22  BUN 10  CREATININE 0.49  GLUCOSE 135*  CALCIUM 8.7*   No results for input(s): "LABPT", "INR" in the last 72 hours.  Neurologically intact Neurovascular intact Sensation intact distally Intact pulses distally Dorsiflexion/Plantar flexion intact Incision: dressing C/D/I No cellulitis present Compartment soft   Assessment/Plan: 2 Days Post-Op Procedure(s) (LRB): RIGHT TOTAL HIP ARTHROPLASTY ANTERIOR APPROACH (Right) Advance diet Up with therapy Discharge home with home health once cleared by PT.  Would need to participate in two PT sessions today.  Dr. Magnus Ivan to cosign inpatient order prior to d/c order being able to be signed.  This will happen later this afternoon.  Once discharge order is in, this may be cancelled if patient is not cleared to go home today. WBAT RLE ? Orthostatic hypotension- will order fluid bolus.  Push po fluids which was discussed with patient.         [name] 06/11/2023, 8:29 AM

## 2023-06-11 NOTE — Discharge Summary (Signed)
Patient ID: Lindsay Watts MRN: [id number] DOB/AGE: Jan 08, 1956 67 y.o.  Admit date: 06/09/2023 Discharge date: 06/11/2023  Admission Diagnoses:  Principal Problem:   Unilateral primary osteoarthritis, right hip Active Problems:   Status post total replacement of right hip   Discharge Diagnoses:  Same  Past Medical History:  Diagnosis Date   Anxiety    Arthritis    Asthma    Family history of adverse reaction to anesthesia    mother has severe PONV   Fibromyalgia    GERD (gastroesophageal reflux disease)    PONV (postoperative nausea and vomiting)     Surgeries: Procedure(s): RIGHT TOTAL HIP ARTHROPLASTY ANTERIOR APPROACH on 06/09/2023   Consultants:   Discharged Condition: Improved  Hospital Course: Lindsay Watts is an 67 y.o. female who was admitted 06/09/2023 for operative treatment ofUnilateral primary osteoarthritis, right hip. Patient has severe unremitting pain that affects sleep, daily activities, and work/hobbies. After pre-op clearance the patient was taken to the operating room on 06/09/2023 and underwent  Procedure(s): RIGHT TOTAL HIP ARTHROPLASTY ANTERIOR APPROACH.    Patient was given perioperative antibiotics:  Anti-infectives (From admission, onward)    Start     Dose/Rate Route Frequency Ordered Stop   06/09/23 2030  ceFAZolin (ANCEF) IVPB 1 g/50 mL premix        1 g 100 mL/hr over 30 Minutes Intravenous Every 6 hours 06/09/23 1931 06/10/23 0211   06/09/23 1015  ceFAZolin (ANCEF) IVPB 2g/100 mL premix        2 g 200 mL/hr over 30 Minutes Intravenous On call to O.R. 06/09/23 1008 06/09/23 1320        Patient was given sequential compression devices, early ambulation, and chemoprophylaxis to prevent DVT.  Patient benefited maximally from hospital stay and there were no complications.    Recent vital signs: Patient Vitals for the past 24 hrs:  BP Temp Temp src Pulse Resp SpO2  06/11/23 0550 100/71 98.5 F (36.9 C) Oral 84 18 94 %  06/10/23 2114 104/64  98.8 F (37.1 C) Oral 89 17 96 %  06/10/23 1801 109/63 98.9 F (37.2 C) Oral 88 18 96 %  06/10/23 1302 112/70 98.6 F (37 C) Oral 82 18 99 %  06/10/23 1017 130/77 98.3 F (36.8 C) Oral 85 17 98 %  06/10/23 0959 125/72 98.6 F (37 C) Oral 88 18 98 %     Recent laboratory studies:  Recent Labs    06/10/23 0330  WBC 13.2*  HGB 13.6  HCT 43.0  PLT 280  NA 135  K 4.9  CL 103  CO2 22  BUN 10  CREATININE 0.49  GLUCOSE 135*  CALCIUM 8.7*     Discharge Medications:   Allergies as of 06/11/2023       Reactions   Aspirin Shortness Of Breath   Ibuprofen Shortness Of Breath   Latex Itching        Medication List     TAKE these medications    acetaminophen 500 MG tablet Commonly known as: TYLENOL Take 1,000 mg by mouth every 6 (six) hours as needed for moderate pain or mild pain.   albuterol (2.5 MG/3ML) 0.083% nebulizer solution Commonly known as: PROVENTIL Take 2.5 mg by nebulization every 6 (six) hours as needed for wheezing or shortness of breath.   albuterol 108 (90 Base) MCG/ACT inhaler Commonly known as: VENTOLIN HFA Inhale 2 puffs into the lungs every 6 (six) hours as needed for wheezing or shortness of breath. For shortness of  breath   apixaban 2.5 MG Tabs tablet Commonly known as: ELIQUIS Take 1 tablet (2.5 mg total) by mouth every 12 (twelve) hours.   ascorbic acid 500 MG tablet Commonly known as: VITAMIN C Take 500 mg by mouth daily.   calcium carbonate 1500 (600 Ca) MG Tabs tablet Commonly known as: OSCAL Take 600 mg of elemental calcium by mouth daily.   cetirizine 10 MG tablet Commonly known as: ZYRTEC Take 10 mg by mouth daily as needed for allergies.   cholecalciferol 25 MCG (1000 UNIT) tablet Commonly known as: VITAMIN D3 Take 1,000 Units by mouth daily.   fluticasone 50 MCG/ACT nasal spray Commonly known as: FLONASE Place 1 spray into both nostrils daily.   ibuprofen 200 MG tablet Commonly known as: ADVIL Take 400 mg by mouth  daily as needed for mild pain or moderate pain.   methocarbamol 500 MG tablet Commonly known as: ROBAXIN Take 1 tablet (500 mg total) by mouth every 6 (six) hours as needed for muscle spasms.   multivitamin with minerals tablet Take 1 tablet by mouth daily.   ondansetron 4 MG disintegrating tablet Commonly known as: ZOFRAN-ODT Take 1 tablet (4 mg total) by mouth every 8 (eight) hours as needed for nausea or vomiting.   oxyCODONE 5 MG immediate release tablet Commonly known as: Oxy IR/ROXICODONE Take 1-2 tablets (5-10 mg total) by mouth every 6 (six) hours as needed for moderate pain (pain score 4-6).   vitamin B-12 500 MCG tablet Commonly known as: CYANOCOBALAMIN Take 500 mcg by mouth daily.   vitamin E 180 MG (400 UNITS) capsule Take 400 Units by mouth daily.   zolpidem 5 MG tablet Commonly known as: AMBIEN Take 5 mg by mouth at bedtime as needed for sleep.               Durable Medical Equipment  (From admission, onward)           Start     Ordered   06/09/23 1932  DME 3 n 1  Once        06/09/23 1931   06/09/23 1932  DME Walker rolling  Once       Question Answer Comment  Walker: With 5 Inch Wheels   Patient needs a walker to treat with the following condition Status post total hip replacement, right      06/09/23 1931            Diagnostic Studies: DG Pelvis Portable  Result Date: 06/09/2023 CLINICAL DATA:  Right hip arthroplasty EXAM: PORTABLE PELVIS 1-2 VIEWS COMPARISON:  02/13/2023 FINDINGS: Single frontal view the pelvis includes both hips. Right hip arthroplasty is in the expected position without evidence of acute complication. Postsurgical changes in the overlying soft tissues. Mild left hip osteoarthritis again noted. Sacroiliac joints are normal. IMPRESSION: 1. Unremarkable right hip arthroplasty. Electronically Signed   By: Sharlet Salina M.D.   On: 06/09/2023 16:50   DG HIP UNILAT WITH PELVIS 1V RIGHT  Result Date: 06/09/2023 CLINICAL  DATA:  Right hip arthroplasty EXAM: DG HIP (WITH OR WITHOUT PELVIS) 1V RIGHT COMPARISON:  02/13/2023 FINDINGS: 9 fluoroscopic images are obtained during the performance of the procedure and are provided for interpretation only. Placement of a right hip arthroplasty is identified, in the expected position without evidence of acute complication. Please refer to the operative report. Fluoroscopy time: 18 seconds, 2.4 mGy IMPRESSION: 1. Right hip arthroplasty as above. Please refer to the operative report. Electronically Signed   By: Casimiro Needle  Manson Passey M.D.   On: 06/09/2023 16:49   DG C-Arm 1-60 Min-No Report  Result Date: 06/09/2023 Fluoroscopy was utilized by the requesting physician.  No radiographic interpretation.    Disposition:      Follow-up Information     Kathryne Hitch, MD Follow up in 2 week(s).   Specialty: Orthopedic Surgery Contact information: [address] [place] [address] [phone]                  Signed: [name] 06/11/2023, 8:32 AM

## 2023-06-12 NOTE — TOC Transition Note (Signed)
Transition of Care [facility]) - CM/SW Discharge Note   Patient Details  Name: TAI CHIAO MRN: [id number] Date of Birth: January 22, 1956  [facility] CM/SW Contact:  Amada Jupiter, LCSW Phone Number: 06/12/2023, 12:13 PM   Clinical Narrative:    Met with pt and confirming she has needed DME in the home.  HHPT prearranged with [facility].  No further TOC needs.   Final next level of care: Home w Home Health Services Barriers to Discharge: No Barriers Identified   Patient Goals and CMS Choice      Discharge Placement                         Discharge Plan and Services Additional resources added to the After Visit Summary for                  DME Arranged: N/A DME Agency: NA       HH Arranged: PT HH Agency: [facility]        Social Determinants of Health (SDOH) Interventions SDOH Screenings   Food Insecurity: No Food Insecurity (06/09/2023)  Housing: Low Risk  (06/09/2023)  Transportation Needs: No Transportation Needs (06/09/2023)  Utilities: Not At Risk (06/09/2023)  Tobacco Use: Low Risk  (06/09/2023)     Readmission Risk Interventions    06/12/2023   12:13 PM  Readmission Risk Prevention Plan  Post Dischage Appt Complete  Medication Screening Complete  Transportation Screening Complete

## 2023-06-12 NOTE — Progress Notes (Signed)
Physical Therapy Treatment Patient Details Name: Lindsay Watts MRN: [id number] DOB: September 22, 1956 Today's Date: 06/12/2023   History of Present Illness Pt is a 67 year old female s/p Rt THA on 06/09/23.    PT Comments  Pt agreeable to PT, After discussion with pt and spouse, determined pt does not have any steps at home, near level entry/very small rise to enter home. Discussed technique for home entry/over threshold and pt/spouse verbalize understanding. Pt is requiring decr assist overall thi session however gait distance ltd by nausea--pt has been nauseous d/t meds. Pt is ready to d/c home with spouse assist prn, I do not think another day in hospital will significantly change functional mobility. Pt will benefit from HHPT   If plan is discharge home, recommend the following: A little help with walking and/or transfers;A little help with bathing/dressing/bathroom;Assistance with cooking/housework;Help with stairs or ramp for entrance   Can travel by private vehicle        Equipment Recommendations  Rolling walker (2 wheels)    Recommendations for Other Services       Precautions / Restrictions Precautions Precautions: Fall Restrictions Weight Bearing Restrictions: No RLE Weight Bearing: Weight bearing as tolerated     Mobility  Bed Mobility Overal bed mobility: Needs Assistance Bed Mobility: Supine to Sit     Supine to sit: Supervision     General bed mobility comments: cues for use of gait belt to self assist    Transfers Overall transfer level: Needs assistance Equipment used: Rolling walker (2 wheels) Transfers: Sit to/from Stand Sit to Stand: Supervision   Step pivot transfers: Supervision       General transfer comment: verbal cues for UE and LE positioning    Ambulation/Gait Ambulation/Gait assistance: Supervision, Contact guard assist Gait Distance (Feet): 30 Feet Assistive device: Rolling walker (2 wheels) Gait Pattern/deviations: Step-to pattern,  Decreased stance time - right, Decreased weight shift to right, Decreased step length - right, Decreased step length - left       General Gait Details: pt slow but steady with RW support. pt with difficulty with hip flexion and inching foot along floor and/or leading with Lt LE to assist with advancing Rt LE   Stairs             Wheelchair Mobility     Tilt Bed    Modified Rankin (Stroke Patients Only)       Balance                                            Cognition Arousal: Alert Behavior During Therapy: WFL for tasks assessed/performed, Anxious Overall Cognitive Status: Within Functional Limits for tasks assessed                                          Exercises      General Comments        Pertinent Vitals/Pain Pain Assessment Pain Assessment: 0-10 Pain Score: 3  Pain Location: Rt hip Pain Descriptors / Indicators: Guarding, Grimacing Pain Intervention(s): Limited activity within patient's tolerance, Monitored during session, Premedicated before session, Repositioned    Home Living                          Prior  Function            PT Goals (current goals can now be found in the care plan section) Acute Rehab PT Goals PT Goal Formulation: With patient/family Time For Goal Achievement: 06/17/23 Potential to Achieve Goals: Good Progress towards PT goals: Progressing toward goals    Frequency    7X/week      PT Plan      Co-evaluation              AM-PAC PT "6 Clicks" Mobility   Outcome Measure  Help needed turning from your back to your side while in a flat bed without using bedrails?: A Little Help needed moving from lying on your back to sitting on the side of a flat bed without using bedrails?: A Little Help needed moving to and from a bed to a chair (including a wheelchair)?: A Little Help needed standing up from a chair using your arms (e.g., wheelchair or bedside chair)?: A  Little Help needed to walk in hospital room?: A Little Help needed climbing 3-5 steps with a railing? : A Little 6 Click Score: 18    End of Session Equipment Utilized During Treatment: Gait belt Activity Tolerance: Other (comment) (ltd by nausea) Patient left: with call bell/phone within reach;in chair;with family/visitor present;with chair alarm set Nurse Communication: Mobility status (nausea-pt was premed) PT Visit Diagnosis: Difficulty in walking, not elsewhere classified (R26.2);Pain Pain - Right/Left: Right Pain - part of body: Hip     Time: 1610-9604 PT Time Calculation (min) (ACUTE ONLY): 17 min  Charges:    $Gait Training: 8-22 mins PT General Charges $$ ACUTE PT VISIT: 1 Visit                     Cristianna Cyr, PT  Acute Rehab Dept [facility]) [phone]  06/12/2023    [facility] 06/12/2023, 11:04 AM

## 2023-06-12 NOTE — Progress Notes (Signed)
Patient ID: Lindsay Watts, female   DOB: 1956/03/24, 67 y.o.   MRN: [id number] Patient is awake and alert this morning.  She is eating breakfast.  Her husband is at the bedside as well.  She is very motivated today and would like to be discharged to home once she clears therapy.  I did see the therapy notes and they are going to work on stairs.  Her vital signs are stable.  Her right operative hip is stable.  I did place a new dressing.  She can be discharged home later today.

## 2023-06-13 ENCOUNTER — Encounter (HOSPITAL_COMMUNITY): Payer: Self-pay | Admitting: Orthopaedic Surgery

## 2023-06-13 ENCOUNTER — Telehealth: Payer: Self-pay | Admitting: *Deleted

## 2023-06-13 NOTE — Telephone Encounter (Signed)
Ortho bundle D/C call completed. 

## 2023-06-19 ENCOUNTER — Telehealth: Payer: Self-pay | Admitting: *Deleted

## 2023-06-19 ENCOUNTER — Other Ambulatory Visit: Payer: Self-pay | Admitting: Orthopaedic Surgery

## 2023-06-19 MED ORDER — TRAMADOL HCL 50 MG PO TABS
50.0000 mg | ORAL_TABLET | Freq: Four times a day (QID) | ORAL | 0 refills | Status: AC | PRN
Start: 2023-06-19 — End: ?

## 2023-06-19 NOTE — Telephone Encounter (Signed)
Patient called and states she has broken out in hives last week and over the weekend. She has now stopped her Oxycodone. She has Tramadol in the home prescribed before surgery to get her to her surgery date. She asked if she could take this instead. I told her this would most likely be fine, but I'd ask you. She is going to take OTC benadryl for the hives and itching.

## 2023-06-19 NOTE — Telephone Encounter (Signed)
Patient aware.

## 2023-06-22 ENCOUNTER — Ambulatory Visit (INDEPENDENT_AMBULATORY_CARE_PROVIDER_SITE_OTHER): Payer: Medicare Other | Admitting: Orthopaedic Surgery

## 2023-06-22 DIAGNOSIS — Z96641 Presence of right artificial hip joint: Secondary | ICD-10-CM

## 2023-06-22 NOTE — Progress Notes (Signed)
The patient is here for first postoperative visit status post a right total hip arthroplasty.  She says it has been rough.  She had a lot of anxiety before surgery but I did give her reassurance that she is getting along great and her hip was severely arthritic.  She is an active 67 year old female.  She has been on Eliquis twice a day.  She is only taking tramadol because she does not tolerate narcotics.  On exam her right hip incision looks good.  Staples been removed and Steri-Strips applied.  Her calf is soft.  She can stop Eliquis.  Starting tomorrow she can actually take ibuprofen and even take 3 to 4 tablets 2-3 times daily to help with pain and inflammation.  I would like to see her back in a month to see how she is doing overall.  If there is issues before then she knows to let us know.

## 2023-07-20 ENCOUNTER — Ambulatory Visit: Payer: Medicare Other | Admitting: Orthopaedic Surgery

## 2023-07-20 ENCOUNTER — Encounter: Payer: Self-pay | Admitting: Orthopaedic Surgery

## 2023-07-20 DIAGNOSIS — Z96641 Presence of right artificial hip joint: Secondary | ICD-10-CM

## 2023-07-20 NOTE — Progress Notes (Signed)
The patient is now 6 weeks status post a right total hip arthroplasty.  She is minimally taking tramadol at night but overall is doing well.  She is walking without assistive device and has just a mild limp.  She is trying to ride an exercise bike and an elliptical at the gym.  On exam her right hip incision looks good.  She tolerates me putting at the range of motion of the right hip.  Her leg lengths appear near equal.  This point she will continue to increase her activities as comfort allows with no restrictions.  We will see her back in 3 months with a standing low AP pelvis and lateral of her right operative hip at that visit.  If she does need a refill of tramadol she will let us know.

## 2023-08-18 ENCOUNTER — Other Ambulatory Visit: Payer: Self-pay | Admitting: Family Medicine

## 2023-08-18 DIAGNOSIS — Z1231 Encounter for screening mammogram for malignant neoplasm of breast: Secondary | ICD-10-CM

## 2023-09-22 ENCOUNTER — Ambulatory Visit
Admission: RE | Admit: 2023-09-22 | Discharge: 2023-09-22 | Disposition: A | Discharge status: Home / Self Care | Payer: Medicare Other | Source: Ambulatory Visit | Attending: Family Medicine | Admitting: Family Medicine

## 2023-09-22 DIAGNOSIS — Z1231 Encounter for screening mammogram for malignant neoplasm of breast: Secondary | ICD-10-CM

## 2023-09-29 ENCOUNTER — Telehealth: Payer: Self-pay | Admitting: *Deleted

## 2023-09-29 NOTE — Telephone Encounter (Signed)
Ortho bundle 90 day call completed. 

## 2023-10-19 ENCOUNTER — Other Ambulatory Visit (INDEPENDENT_AMBULATORY_CARE_PROVIDER_SITE_OTHER): Payer: Self-pay

## 2023-10-19 ENCOUNTER — Encounter: Payer: Self-pay | Admitting: Orthopaedic Surgery

## 2023-10-19 ENCOUNTER — Ambulatory Visit (INDEPENDENT_AMBULATORY_CARE_PROVIDER_SITE_OTHER): Payer: Medicare Other | Admitting: Orthopaedic Surgery

## 2023-10-19 DIAGNOSIS — Z96641 Presence of right artificial hip joint: Secondary | ICD-10-CM

## 2023-10-19 NOTE — Progress Notes (Signed)
 The patient is a 68 year old female who is now just over 5 months status post a right total hip arthroplasty.  She states she still having some numbness around the incision and a burning sensation but overall she is doing well.  She reports good range of motion and strength.  She walks without an assistive device.  There is no significant limp.  Her leg lengths appear equal.  She does tolerate me putting her right hip through full internal and external rotation with no difficulty or problems at all.  There is some numbness around the lateral femoral cutaneous nerve distribution.  An AP pelvis and lateral of the right hip shows a well-seated total hip arthroplasty with no complicating features.  I talked her about trying some Voltaren gel on and around her incision up to 2-3 times daily.  This can help as well as this time.  The next time we need to see her is not for 6 months.  At that visit we will have a final standing AP pelvis.  If there are issues before then she knows to reach out and let us  know.

## 2024-01-18 ENCOUNTER — Telehealth: Payer: Self-pay | Admitting: *Deleted

## 2024-01-18 NOTE — Telephone Encounter (Signed)
 Patient called me today and states she has fallen twice since her surgery. R-THA on 06/09/23. She is very active and states she fell in grass, but really rolled. Has had pain ever since around the hip, but not into the groin. Able to bear weight, but states it really is aching. She isn't scheduled until July. I told her probably best to be seen. Has not improved over the last 4 days since she fell this weekend. Recommendations?

## 2024-01-19 NOTE — Telephone Encounter (Signed)
 Patient aware and appt made for her on the 24th; she asked if she is feeling better after "resting" can she cancel, I told her to do what feels most comfortable to her

## 2024-02-01 ENCOUNTER — Ambulatory Visit: Admitting: Physician Assistant

## 2024-04-17 ENCOUNTER — Encounter: Payer: Self-pay | Admitting: Orthopaedic Surgery

## 2024-04-17 ENCOUNTER — Ambulatory Visit (INDEPENDENT_AMBULATORY_CARE_PROVIDER_SITE_OTHER): Payer: Medicare Other | Admitting: Orthopaedic Surgery

## 2024-04-17 ENCOUNTER — Other Ambulatory Visit (INDEPENDENT_AMBULATORY_CARE_PROVIDER_SITE_OTHER)

## 2024-04-17 DIAGNOSIS — Z96641 Presence of right artificial hip joint: Secondary | ICD-10-CM

## 2024-04-17 DIAGNOSIS — M25511 Pain in right shoulder: Secondary | ICD-10-CM | POA: Diagnosis not present

## 2024-04-17 DIAGNOSIS — G8929 Other chronic pain: Secondary | ICD-10-CM

## 2024-04-17 MED ORDER — LIDOCAINE HCL 1 % IJ SOLN
3.0000 mL | INTRAMUSCULAR | Status: AC | PRN
Start: 2024-04-17 — End: 2024-04-17
  Administered 2024-04-17: 3 mL

## 2024-04-17 MED ORDER — METHYLPREDNISOLONE ACETATE 40 MG/ML IJ SUSP
40.0000 mg | INTRAMUSCULAR | Status: AC | PRN
Start: 2024-04-17 — End: 2024-04-17
  Administered 2024-04-17: 40 mg via INTRA_ARTICULAR

## 2024-04-17 NOTE — Progress Notes (Signed)
 The patient is now 11 months status post a right total hip arthroplasty.  She is a very active 68 year old female.  She even rides a wave runner on the leg.  She says the hip is doing great.  On exam she is walking with a normal gait.  Her leg lengths are equal.  Her right hip moves smoothly and fluidly as is her left hip.  An AP pelvis shows a well-seated right total hip arthroplasty with no complicating features.  The components are well-seated.  The left hip joint space is well-maintained.  She did let me know that her right shoulder been bothering her recently and this is been going on actually for a while now.  It hurts with overhead activities and reaching behind her with no known injury.  On examination of her right shoulder she does show signs of impingement but the rotator cuff seems to be intact.  I did offer her steroid injection in her right shoulder subacromial outlet which she did tolerate well.  If the shoulder does not get better she knows to contact us  and come back and see us  for the shoulder.  Follow-up for hip is as needed.  All questions and concerns were addressed and answered.    Procedure Note  Patient: Lindsay Watts             Date of Birth: Mar 28, 1956           MRN: [id number]             Visit Date: 04/17/2024  Procedures: Visit Diagnoses:  1. History of right hip replacement   2. Chronic right shoulder pain     Large Joint Inj: R subacromial bursa on 04/17/2024 8:49 AM Indications: pain and diagnostic evaluation Details: 22 G 1.5 in needle  Arthrogram: No  Medications: 3 mL lidocaine  1 %; 40 mg methylPREDNISolone  acetate 40 MG/ML Outcome: tolerated well, no immediate complications Procedure, treatment alternatives, risks and benefits explained, specific risks discussed. Consent was given by the patient. Immediately prior to procedure a time out was called to verify the correct patient, procedure, equipment, support staff and site/side marked as required.  Patient was prepped and draped in the usual sterile fashion.

## 2024-09-10 ENCOUNTER — Other Ambulatory Visit: Payer: Self-pay | Admitting: Family Medicine

## 2024-09-10 DIAGNOSIS — Z1231 Encounter for screening mammogram for malignant neoplasm of breast: Secondary | ICD-10-CM

## 2024-10-14 ENCOUNTER — Ambulatory Visit

## 2024-10-15 ENCOUNTER — Ambulatory Visit

## 2024-10-22 ENCOUNTER — Ambulatory Visit
Admission: RE | Admit: 2024-10-22 | Discharge: 2024-10-22 | Disposition: A | Source: Ambulatory Visit | Attending: Family Medicine | Admitting: Family Medicine

## 2024-10-22 DIAGNOSIS — Z1231 Encounter for screening mammogram for malignant neoplasm of breast: Secondary | ICD-10-CM
